# Patient Record
Sex: Male | Born: 1945 | Race: Black or African American | Hispanic: No | Marital: Single | Smoking: Never smoker
Health system: Southern US, Community
[De-identification: ages and names within clinical notes are randomized; demographics above are authoritative.]

## PROBLEM LIST (undated history)

## (undated) DIAGNOSIS — E785 Hyperlipidemia, unspecified: Secondary | ICD-10-CM

## (undated) DIAGNOSIS — M109 Gout, unspecified: Secondary | ICD-10-CM

## (undated) DIAGNOSIS — I10 Essential (primary) hypertension: Secondary | ICD-10-CM

## (undated) DIAGNOSIS — M1990 Unspecified osteoarthritis, unspecified site: Secondary | ICD-10-CM

---

## 2014-12-22 ENCOUNTER — Emergency Department: Payer: Medicare Other

## 2014-12-22 ENCOUNTER — Encounter: Payer: Self-pay | Admitting: Emergency Medicine

## 2014-12-22 ENCOUNTER — Inpatient Hospital Stay
Admission: EM | Admit: 2014-12-22 | Discharge: 2015-01-08 | DRG: 870 | Disposition: E | Payer: Medicare Other | Attending: Specialist | Admitting: Specialist

## 2014-12-22 DIAGNOSIS — M1000 Idiopathic gout, unspecified site: Secondary | ICD-10-CM | POA: Diagnosis present

## 2014-12-22 DIAGNOSIS — G9349 Other encephalopathy: Secondary | ICD-10-CM | POA: Diagnosis present

## 2014-12-22 DIAGNOSIS — Z66 Do not resuscitate: Secondary | ICD-10-CM | POA: Diagnosis not present

## 2014-12-22 DIAGNOSIS — A4101 Sepsis due to Methicillin susceptible Staphylococcus aureus: Secondary | ICD-10-CM | POA: Diagnosis not present

## 2014-12-22 DIAGNOSIS — Z515 Encounter for palliative care: Secondary | ICD-10-CM | POA: Diagnosis not present

## 2014-12-22 DIAGNOSIS — D72829 Elevated white blood cell count, unspecified: Secondary | ICD-10-CM | POA: Diagnosis not present

## 2014-12-22 DIAGNOSIS — E1165 Type 2 diabetes mellitus with hyperglycemia: Secondary | ICD-10-CM | POA: Diagnosis present

## 2014-12-22 DIAGNOSIS — I639 Cerebral infarction, unspecified: Secondary | ICD-10-CM | POA: Diagnosis present

## 2014-12-22 DIAGNOSIS — D649 Anemia, unspecified: Secondary | ICD-10-CM | POA: Diagnosis present

## 2014-12-22 DIAGNOSIS — E875 Hyperkalemia: Secondary | ICD-10-CM | POA: Diagnosis not present

## 2014-12-22 DIAGNOSIS — I129 Hypertensive chronic kidney disease with stage 1 through stage 4 chronic kidney disease, or unspecified chronic kidney disease: Secondary | ICD-10-CM | POA: Diagnosis present

## 2014-12-22 DIAGNOSIS — M109 Gout, unspecified: Secondary | ICD-10-CM | POA: Insufficient documentation

## 2014-12-22 DIAGNOSIS — E871 Hypo-osmolality and hyponatremia: Secondary | ICD-10-CM | POA: Diagnosis present

## 2014-12-22 DIAGNOSIS — Z4659 Encounter for fitting and adjustment of other gastrointestinal appliance and device: Secondary | ICD-10-CM

## 2014-12-22 DIAGNOSIS — R4182 Altered mental status, unspecified: Secondary | ICD-10-CM | POA: Diagnosis not present

## 2014-12-22 DIAGNOSIS — Z978 Presence of other specified devices: Secondary | ICD-10-CM

## 2014-12-22 DIAGNOSIS — G8194 Hemiplegia, unspecified affecting left nondominant side: Secondary | ICD-10-CM | POA: Diagnosis present

## 2014-12-22 DIAGNOSIS — I959 Hypotension, unspecified: Secondary | ICD-10-CM | POA: Diagnosis not present

## 2014-12-22 DIAGNOSIS — I10 Essential (primary) hypertension: Secondary | ICD-10-CM | POA: Diagnosis not present

## 2014-12-22 DIAGNOSIS — Z452 Encounter for adjustment and management of vascular access device: Secondary | ICD-10-CM

## 2014-12-22 DIAGNOSIS — N170 Acute kidney failure with tubular necrosis: Secondary | ICD-10-CM | POA: Diagnosis not present

## 2014-12-22 DIAGNOSIS — R4702 Dysphasia: Secondary | ICD-10-CM | POA: Diagnosis present

## 2014-12-22 DIAGNOSIS — T17908D Unspecified foreign body in respiratory tract, part unspecified causing other injury, subsequent encounter: Secondary | ICD-10-CM

## 2014-12-22 DIAGNOSIS — F10231 Alcohol dependence with withdrawal delirium: Secondary | ICD-10-CM | POA: Diagnosis present

## 2014-12-22 DIAGNOSIS — E669 Obesity, unspecified: Secondary | ICD-10-CM | POA: Diagnosis present

## 2014-12-22 DIAGNOSIS — E785 Hyperlipidemia, unspecified: Secondary | ICD-10-CM | POA: Diagnosis present

## 2014-12-22 DIAGNOSIS — G40909 Epilepsy, unspecified, not intractable, without status epilepticus: Secondary | ICD-10-CM | POA: Diagnosis present

## 2014-12-22 DIAGNOSIS — J690 Pneumonitis due to inhalation of food and vomit: Secondary | ICD-10-CM | POA: Diagnosis not present

## 2014-12-22 DIAGNOSIS — F1010 Alcohol abuse, uncomplicated: Secondary | ICD-10-CM

## 2014-12-22 DIAGNOSIS — R29810 Facial weakness: Secondary | ICD-10-CM | POA: Diagnosis present

## 2014-12-22 DIAGNOSIS — N189 Chronic kidney disease, unspecified: Secondary | ICD-10-CM | POA: Diagnosis present

## 2014-12-22 DIAGNOSIS — R6520 Severe sepsis without septic shock: Secondary | ICD-10-CM | POA: Diagnosis not present

## 2014-12-22 DIAGNOSIS — A4181 Sepsis due to Enterococcus: Secondary | ICD-10-CM | POA: Diagnosis not present

## 2014-12-22 DIAGNOSIS — R569 Unspecified convulsions: Secondary | ICD-10-CM

## 2014-12-22 DIAGNOSIS — J9600 Acute respiratory failure, unspecified whether with hypoxia or hypercapnia: Secondary | ICD-10-CM | POA: Diagnosis not present

## 2014-12-22 DIAGNOSIS — E878 Other disorders of electrolyte and fluid balance, not elsewhere classified: Secondary | ICD-10-CM | POA: Diagnosis present

## 2014-12-22 DIAGNOSIS — M1990 Unspecified osteoarthritis, unspecified site: Secondary | ICD-10-CM | POA: Diagnosis present

## 2014-12-22 DIAGNOSIS — E876 Hypokalemia: Secondary | ICD-10-CM

## 2014-12-22 DIAGNOSIS — B952 Enterococcus as the cause of diseases classified elsewhere: Secondary | ICD-10-CM

## 2014-12-22 DIAGNOSIS — R1310 Dysphagia, unspecified: Secondary | ICD-10-CM | POA: Diagnosis present

## 2014-12-22 DIAGNOSIS — Z880 Allergy status to penicillin: Secondary | ICD-10-CM | POA: Diagnosis not present

## 2014-12-22 DIAGNOSIS — J9601 Acute respiratory failure with hypoxia: Secondary | ICD-10-CM | POA: Diagnosis not present

## 2014-12-22 DIAGNOSIS — Z0189 Encounter for other specified special examinations: Secondary | ICD-10-CM

## 2014-12-22 DIAGNOSIS — R0989 Other specified symptoms and signs involving the circulatory and respiratory systems: Secondary | ICD-10-CM

## 2014-12-22 DIAGNOSIS — R7881 Bacteremia: Secondary | ICD-10-CM

## 2014-12-22 DIAGNOSIS — R0602 Shortness of breath: Secondary | ICD-10-CM

## 2014-12-22 DIAGNOSIS — T17908A Unspecified foreign body in respiratory tract, part unspecified causing other injury, initial encounter: Secondary | ICD-10-CM | POA: Diagnosis present

## 2014-12-22 DIAGNOSIS — J15211 Pneumonia due to Methicillin susceptible Staphylococcus aureus: Secondary | ICD-10-CM

## 2014-12-22 DIAGNOSIS — Z6841 Body Mass Index (BMI) 40.0 and over, adult: Secondary | ICD-10-CM | POA: Diagnosis not present

## 2014-12-22 HISTORY — DX: Hyperlipidemia, unspecified: E78.5

## 2014-12-22 HISTORY — DX: Essential (primary) hypertension: I10

## 2014-12-22 HISTORY — DX: Gout, unspecified: M10.9

## 2014-12-22 HISTORY — DX: Unspecified osteoarthritis, unspecified site: M19.90

## 2014-12-22 LAB — COMPREHENSIVE METABOLIC PANEL
ALBUMIN: 4 g/dL (ref 3.5–5.0)
ALT: 32 U/L (ref 17–63)
AST: 128 U/L — AB (ref 15–41)
Alkaline Phosphatase: 80 U/L (ref 38–126)
Anion gap: 24 — ABNORMAL HIGH (ref 5–15)
CHLORIDE: 76 mmol/L — AB (ref 101–111)
CO2: 21 mmol/L — ABNORMAL LOW (ref 22–32)
Calcium: 8.2 mg/dL — ABNORMAL LOW (ref 8.9–10.3)
Creatinine, Ser: 1.21 mg/dL (ref 0.61–1.24)
GFR calc non Af Amer: 59 mL/min — ABNORMAL LOW (ref >60)
Glucose, Bld: 141 mg/dL — ABNORMAL HIGH (ref 65–99)
Potassium: 2.6 mmol/L — CL (ref 3.5–5.1)
SODIUM: 121 mmol/L — AB (ref 135–145)
TOTAL PROTEIN: 6.9 g/dL (ref 6.5–8.1)
Total Bilirubin: 1.7 mg/dL — ABNORMAL HIGH (ref 0.3–1.2)

## 2014-12-22 LAB — URINALYSIS COMPLETE WITH MICROSCOPIC (ARMC ONLY)
BILIRUBIN URINE: NEGATIVE
Glucose, UA: NEGATIVE mg/dL
Ketones, ur: NEGATIVE mg/dL
LEUKOCYTES UA: NEGATIVE
Nitrite: NEGATIVE
PH: 5 (ref 5.0–8.0)
PROTEIN: 30 mg/dL — AB
SPECIFIC GRAVITY, URINE: 1.011 (ref 1.005–1.030)
Squamous Epithelial / LPF: NONE SEEN

## 2014-12-22 LAB — CBC WITH DIFFERENTIAL/PLATELET
Basophils Absolute: 0 10*3/uL (ref 0–0.1)
Basophils Relative: 0 %
EOS ABS: 0 10*3/uL (ref 0–0.7)
Eosinophils Relative: 0 %
HCT: 37.8 % — ABNORMAL LOW (ref 40.0–52.0)
Hemoglobin: 11.8 g/dL — ABNORMAL LOW (ref 13.0–18.0)
LYMPHS PCT: 7 %
Lymphs Abs: 1.9 10*3/uL (ref 1.0–3.6)
MCH: 25.6 pg — ABNORMAL LOW (ref 26.0–34.0)
MCHC: 31.2 g/dL — AB (ref 32.0–36.0)
MCV: 82.1 fL (ref 80.0–100.0)
Monocytes Absolute: 2.2 10*3/uL — ABNORMAL HIGH (ref 0.2–1.0)
Monocytes Relative: 8 %
Neutro Abs: 23.5 10*3/uL — ABNORMAL HIGH (ref 1.4–6.5)
Neutrophils Relative %: 85 %
Platelets: 283 10*3/uL (ref 150–440)
RBC: 4.61 MIL/uL (ref 4.40–5.90)
RDW: 18.5 % — ABNORMAL HIGH (ref 11.5–14.5)
WBC: 27.6 10*3/uL — ABNORMAL HIGH (ref 3.8–10.6)

## 2014-12-22 LAB — GLUCOSE, CAPILLARY: Glucose-Capillary: 125 mg/dL — ABNORMAL HIGH (ref 65–99)

## 2014-12-22 LAB — URINE DRUG SCREEN, QUALITATIVE (ARMC ONLY)
AMPHETAMINES, UR SCREEN: NOT DETECTED
Barbiturates, Ur Screen: NOT DETECTED
Benzodiazepine, Ur Scrn: NOT DETECTED
CANNABINOID 50 NG, UR ~~LOC~~: NOT DETECTED
Cocaine Metabolite,Ur ~~LOC~~: NOT DETECTED
MDMA (Ecstasy)Ur Screen: NOT DETECTED
METHADONE SCREEN, URINE: NOT DETECTED
Opiate, Ur Screen: NOT DETECTED
Phencyclidine (PCP) Ur S: NOT DETECTED
Tricyclic, Ur Screen: NOT DETECTED

## 2014-12-22 LAB — TROPONIN I: TROPONIN I: 0.04 ng/mL — AB (ref <0.031)

## 2014-12-22 LAB — ETHANOL

## 2014-12-22 LAB — LACTIC ACID, PLASMA: LACTIC ACID, VENOUS: 16.4 mmol/L — AB (ref 0.5–2.0)

## 2014-12-22 MED ORDER — PIPERACILLIN-TAZOBACTAM 3.375 G IVPB: 3.3750 g | Freq: Three times a day (TID) | INTRAVENOUS | Status: DC

## 2014-12-22 MED ORDER — POTASSIUM CHLORIDE IN NACL 40-0.9 MEQ/L-% IV SOLN
INTRAVENOUS | Status: AC
  Administered 2014-12-22: 150 mL/h via INTRAVENOUS
  Filled 2014-12-22: qty 1000

## 2014-12-22 MED ORDER — METOPROLOL TARTRATE 1 MG/ML IV SOLN
5.0000 mg | Freq: Four times a day (QID) | INTRAVENOUS | Status: DC
  Administered 2014-12-22 – 2015-01-03 (×47): 5 mg via INTRAVENOUS
  Filled 2014-12-22 (×47): qty 5

## 2014-12-22 MED ORDER — ASPIRIN EC 81 MG PO TBEC
81.0000 mg | DELAYED_RELEASE_TABLET | Freq: Every day | ORAL | Status: DC
Start: 2014-12-23 — End: 2014-12-23

## 2014-12-22 MED ORDER — METOPROLOL TARTRATE 1 MG/ML IV SOLN
INTRAVENOUS | Status: AC
  Administered 2014-12-22: 5 mg via INTRAVENOUS
  Filled 2014-12-22: qty 5

## 2014-12-22 MED ORDER — LORAZEPAM 2 MG/ML IJ SOLN
2.0000 mg | INTRAMUSCULAR | Status: DC | PRN
  Administered 2014-12-23 – 2014-12-25 (×2): 2 mg via INTRAVENOUS
  Filled 2014-12-22 (×2): qty 1

## 2014-12-22 MED ORDER — PIPERACILLIN-TAZOBACTAM 3.375 G IVPB
3.3750 g | Freq: Once | INTRAVENOUS | Status: DC
  Administered 2014-12-22: 3.375 g via INTRAVENOUS

## 2014-12-22 MED ORDER — ALBUTEROL SULFATE (2.5 MG/3ML) 0.083% IN NEBU
2.5000 mg | INHALATION_SOLUTION | Freq: Four times a day (QID) | RESPIRATORY_TRACT | Status: DC
  Administered 2014-12-22 – 2015-01-02 (×45): 2.5 mg via RESPIRATORY_TRACT
  Filled 2014-12-22 (×44): qty 3

## 2014-12-22 MED ORDER — ACETAMINOPHEN 650 MG RE SUPP
650.0000 mg | Freq: Four times a day (QID) | RECTAL | Status: DC | PRN
  Filled 2014-12-22: qty 1

## 2014-12-22 MED ORDER — KCL IN DEXTROSE-NACL 40-5-0.45 MEQ/L-%-% IV SOLN
INTRAVENOUS | Status: AC
  Administered 2014-12-22: 1000 mL via INTRAVENOUS
  Filled 2014-12-22: qty 1000

## 2014-12-22 MED ORDER — LORAZEPAM 2 MG/ML IJ SOLN
INTRAMUSCULAR | Status: AC
  Filled 2014-12-22: qty 1

## 2014-12-22 MED ORDER — ONDANSETRON HCL 4 MG PO TABS: 4.0000 mg | ORAL_TABLET | Freq: Four times a day (QID) | ORAL | Status: DC | PRN

## 2014-12-22 MED ORDER — PIPERACILLIN-TAZOBACTAM 3.375 G IVPB
INTRAVENOUS | Status: AC
  Administered 2014-12-22: 3.375 g via INTRAVENOUS
  Filled 2014-12-22: qty 50

## 2014-12-22 MED ORDER — VANCOMYCIN HCL IN DEXTROSE 1-5 GM/200ML-% IV SOLN
INTRAVENOUS | Status: AC
  Administered 2014-12-22: 1000 mg via INTRAVENOUS
  Filled 2014-12-22: qty 200

## 2014-12-22 MED ORDER — LORAZEPAM 2 MG/ML IJ SOLN
2.0000 mg | Freq: Once | INTRAMUSCULAR | Status: AC
  Administered 2014-12-22: 2 mg via INTRAVENOUS

## 2014-12-22 MED ORDER — ONDANSETRON HCL 4 MG/2ML IJ SOLN: 4.0000 mg | Freq: Four times a day (QID) | INTRAMUSCULAR | Status: DC | PRN

## 2014-12-22 MED ORDER — PANTOPRAZOLE SODIUM 40 MG IV SOLR
40.0000 mg | Freq: Two times a day (BID) | INTRAVENOUS | Status: DC
  Administered 2014-12-22 – 2014-12-23 (×2): 40 mg via INTRAVENOUS
  Filled 2014-12-22 (×2): qty 40

## 2014-12-22 MED ORDER — HEPARIN SODIUM (PORCINE) 5000 UNIT/ML IJ SOLN
5000.0000 [IU] | Freq: Three times a day (TID) | INTRAMUSCULAR | Status: DC
  Administered 2014-12-22 – 2015-01-03 (×35): 5000 [IU] via SUBCUTANEOUS
  Filled 2014-12-22 (×34): qty 1

## 2014-12-22 MED ORDER — ADULT MULTIVITAMIN W/MINERALS CH: 1.0000 | ORAL_TABLET | Freq: Every day | ORAL | Status: DC

## 2014-12-22 MED ORDER — LEVOFLOXACIN IN D5W 500 MG/100ML IV SOLN
500.0000 mg | INTRAVENOUS | Status: DC
  Administered 2014-12-22: 500 mg via INTRAVENOUS
  Filled 2014-12-22: qty 100

## 2014-12-22 MED ORDER — VANCOMYCIN HCL IN DEXTROSE 1-5 GM/200ML-% IV SOLN
1000.0000 mg | Freq: Once | INTRAVENOUS | Status: AC
  Administered 2014-12-22: 1000 mg via INTRAVENOUS

## 2014-12-22 MED ORDER — VITAMIN B-1 100 MG PO TABS: 100.0000 mg | ORAL_TABLET | Freq: Every day | ORAL | Status: DC

## 2014-12-22 MED ORDER — SODIUM CHLORIDE 0.9 % IV SOLN
Freq: Once | INTRAVENOUS | Status: AC
  Administered 2014-12-22: 1000 mL via INTRAVENOUS

## 2014-12-22 MED ORDER — DOCUSATE SODIUM 100 MG PO CAPS
100.0000 mg | ORAL_CAPSULE | Freq: Two times a day (BID) | ORAL | Status: DC
  Administered 2014-12-25 – 2014-12-26 (×2): 100 mg via ORAL
  Filled 2014-12-22 (×4): qty 1

## 2014-12-22 MED ORDER — POTASSIUM CHLORIDE IN NACL 40-0.9 MEQ/L-% IV SOLN
INTRAVENOUS | Status: DC
  Administered 2014-12-22 – 2014-12-23 (×4): 150 mL/h via INTRAVENOUS
  Administered 2014-12-24: 100 mL/h via INTRAVENOUS
  Administered 2014-12-24 (×2): 150 mL/h via INTRAVENOUS
  Administered 2014-12-25 – 2014-12-26 (×4): 100 mL/h via INTRAVENOUS
  Filled 2014-12-22 (×18): qty 1000

## 2014-12-22 MED ORDER — SODIUM CHLORIDE 0.9 % IJ SOLN
3.0000 mL | Freq: Two times a day (BID) | INTRAMUSCULAR | Status: DC
  Administered 2014-12-22 – 2015-01-02 (×17): 3 mL via INTRAVENOUS

## 2014-12-22 MED ORDER — FOLIC ACID 1 MG PO TABS: 1.0000 mg | ORAL_TABLET | Freq: Every day | ORAL | Status: DC

## 2014-12-22 MED ORDER — KCL IN DEXTROSE-NACL 40-5-0.45 MEQ/L-%-% IV SOLN
INTRAVENOUS | Status: DC
  Administered 2014-12-22: 1000 mL via INTRAVENOUS

## 2014-12-22 MED ORDER — SODIUM CHLORIDE 0.9 % IV SOLN
500.0000 mg | Freq: Two times a day (BID) | INTRAVENOUS | Status: DC
  Administered 2014-12-22 – 2014-12-25 (×6): 500 mg via INTRAVENOUS
  Filled 2014-12-22 (×8): qty 5

## 2014-12-22 MED ORDER — LEVOFLOXACIN IN D5W 500 MG/100ML IV SOLN
INTRAVENOUS | Status: AC
  Administered 2014-12-22: 500 mg via INTRAVENOUS
  Filled 2014-12-22: qty 100

## 2014-12-22 MED ORDER — ACETAMINOPHEN 325 MG PO TABS
650.0000 mg | ORAL_TABLET | Freq: Four times a day (QID) | ORAL | Status: DC | PRN
Start: 2014-12-22 — End: 2015-01-03
  Administered 2014-12-26 – 2014-12-30 (×3): 650 mg via ORAL
  Filled 2014-12-22 (×3): qty 2

## 2014-12-22 NOTE — H&P (Signed)
History and Physical    Cristie HemLester Dreisbach XBJ:[id number] DOB: 12/17/1945 DOA: 2015-02-10  Referring physician: Dr.Williams [facility]CP: [facility]  Specialists: none  Chief Complaint: CVA  HPI: Cristie HemLester Gluth is a 69 y.o. male has a past medical history significant for HTN and gout found by family today with let facial droop, left-sided weakness, and unresponsiveness. Had apparently been drinking ETOH heavily. Had seizure in ER requiring ativan. Now lethargic and sedated. Unable to give hx.  Review of Systems: unable to obtain  Past Medical History  Diagnosis Date  . Hypertension   . Hyperlipidemia   . Arthritis   . Gout    History reviewed. No pertinent past surgical history. Social History:  reports that he has never smoked. He does not have any smokeless tobacco history on file. He reports that he drinks alcohol. His drug history is not on file.  Allergies  Allergen Reactions  . Penicillins Other (See Comments)    Unknown reaction    Family History  Problem Relation Age of Onset  . Family history unknown: Yes    Prior to Admission medications   Medication Sig Start Date End Date Taking? Authorizing Provider  amLODipine (NORVASC) 5 MG tablet Take 5 mg by mouth daily.   Yes Historical Provider, MD  colchicine 0.6 MG tablet Take 0.6 mg by mouth daily.   Yes Historical Provider, MD  lisinopril (PRINIVIL,ZESTRIL) 40 MG tablet Take 40 mg by mouth daily.   Yes Historical Provider, MD  Multiple Vitamins-Minerals (CENTRUM SILVER ADULT 50+) TABS Take 1 tablet by mouth daily.   Yes Historical Provider, MD  vitamin B-12 (CYANOCOBALAMIN) 1000 MCG tablet Take 1,000 mcg by mouth daily.   Yes Historical Provider, MD   Physical Exam: Filed Vitals:   25-Dec-2014 1645 25-Dec-2014 1700 25-Dec-2014 1715 25-Dec-2014 1730  BP:  146/87    Pulse: 99 104 102 103  Temp:      TempSrc:      Resp: 17 15 15 16   Height:      Weight:      SpO2: 96% 97% 96% 97%     General:  Lethargic, not verbally  responsive  Eyes: PERRL, EOMI, no scleral icterus  ENT: moist oropharynx  Neck: supple, no lymphadenopathy  Cardiovascular: regular rate without MRG; 2+ peripheral pulses, no JVD, no peripheral edema  Respiratory: diffuse rhonchi without wheezes or rales.  Abdomen: soft, non tender to palpation, positive bowel sounds, no guarding, no rebound  Skin: no rashes  Musculoskeletal: normal bulk and tone, no joint swelling  Psychiatric: unable to assess  Neurologic: Left facial droop and LUE weakness present.  Labs on Admission:  Basic Metabolic Panel:  Recent Labs Lab 25-Dec-2014 1444  NA 121*  K 2.6*  CL 76*  CO2 21*  GLUCOSE 141*  BUN <5*  CREATININE 1.21  CALCIUM 8.2*   Liver Function Tests:  Recent Labs Lab 25-Dec-2014 1444  AST 128*  ALT 32  ALKPHOS 80  BILITOT 1.7*  PROT 6.9  ALBUMIN 4.0   No results for input(s): LIPASE, AMYLASE in the last 168 hours. No results for input(s): AMMONIA in the last 168 hours. CBC:  Recent Labs Lab 25-Dec-2014 1444  WBC 27.6*  NEUTROABS 23.5*  HGB 11.8*  HCT 37.8*  MCV 82.1  PLT 283   Cardiac Enzymes: No results for input(s): CKTOTAL, CKMB, CKMBINDEX, TROPONINI in the last 168 hours.  BNP (last 3 results) No results for input(s): BNP in the last 8760 hours.  ProBNP (last 3 results) No  results for input(s): PROBNP in the last 8760 hours.  CBG: No results for input(s): GLUCAP in the last 168 hours.  Radiological Exams on Admission: Dg Chest 2 View  04/05/15   CLINICAL DATA:  Possible aspiration.  Abnormal lung sounds.  EXAM: CHEST  2 VIEW  COMPARISON:  None.  FINDINGS: The heart size and mediastinal contours are within normal limits. Both lungs are clear. No pneumothorax or pleural effusion is noted. The visualized skeletal structures are unremarkable.  IMPRESSION: No active cardiopulmonary disease.   Electronically Signed   By: Lupita RaiderJames  Green Jr, M.D.   On: 008/27/16 15:02   Ct Head (brain) Wo Contrast  04/05/15    CLINICAL DATA:  Unresponsive, left-sided facial droop.  EXAM: CT HEAD WITHOUT CONTRAST  TECHNIQUE: Contiguous axial images were obtained from the base of the skull through the vertex without intravenous contrast.  COMPARISON:  None.  FINDINGS: Bony calvarium appears intact. Minimal diffuse cortical atrophy is noted. Mild chronic ischemic white matter disease is noted. No mass effect or midline shift is noted. Ventricular size is within normal limits. There is no evidence of mass lesion, hemorrhage or acute infarction.  IMPRESSION: Minimal diffuse cortical atrophy. Mild chronic ischemic white matter disease. No acute intracranial abnormality seen. These results were called by telephone at the time of interpretation on 04/05/15 at 2:42 pm to Dr. Lenard LancePaduchowski, who verbally acknowledged these results.   Electronically Signed   By: Lupita RaiderJames  Green Jr, M.D.   On: 008/27/16 14:44    EKG: Independently reviewed.  Assessment/Plan Active Problems:   CVA (cerebral infarction)   Seizure disorder   Will admit to ICU with IV ABX and IV Keppra. Order MRI of brain with carotids and echo. Neuro checks q4h and consult Neurology. Begin IV fluids with K+ and f/u labs in AM.  Diet: NPO Fluids: see records DVT Prophylaxis: SQ Heparin  Code Status: FULL  Family Communication: yes  Disposition Plan: SNF  Time spent: 6550

## 2014-12-22 NOTE — ED Provider Notes (Signed)
[facility] Emergency Department Provider Note  ____________________________________________  Time seen: On arrival  I have reviewed the triage vital signs and the nursing notes.   HISTORY  Chief Complaint No chief complaint on file.   History Limited secondary to altered mental status   HPI Lucas Ortega is a 69 y.o. male who presents from home with possible stroke. History is limited but reportedly patient was house sitting and when sister came home today she found that the left side of his face was drooping. He also had an empty bottle of gin next to him. It is unclear when symptoms started. Patient says that he feels "okay". No history of the same    No past medical history on file.  There are no active problems to display for this patient.   No past surgical history on file.  No current outpatient prescriptions on file.  Allergies Review of patient's allergies indicates not on file.  No family history on file.  Social History History  Substance Use Topics  . Smoking status: Not on file  . Smokeless tobacco: Not on file  . Alcohol Use: Not on file    Review of Systems  Constitutional: Negative for fever. Eyes: Negative for visual changes. ENT: Negative for sore throat. Cardiovascular: Negative for chest pain. Respiratory: Negative for shortness of breath. Gastrointestinal: Positive for vomiting Genitourinary: Negative for dysuria. Musculoskeletal: Negative for back pain. Skin: Negative for rash. Neurological: Negative for headaches, focal weakness or numbness.   10-point ROS otherwise negative.  ____________________________________________   PHYSICAL EXAM: BP 160/87 mmHg  Pulse 101  Temp(Src) 97.8 F (36.6 C) (Oral)  Resp 28  Ht 5\' 10"  (1.778 m)  Wt 276 lb (125.193 kg)  BMI 39.60 kg/m2  SpO2 96%     Constitutional: Poor hygiene, slow to respond Eyes: Conjunctivae are normal. PERRL. Normal extraocular  movements. ENT   Head: Normocephalic and atraumatic.   Mouth/Throat: Mucous membranes are moist. . Cardiovascular: Normal rate, regular rhythm. Normal and symmetric distal pulses are present in all extremities. No murmurs, rubs, or gallops. Respiratory: Normal respiratory effort without tachypnea nor retractions. Breath sounds are clear and equal bilaterally. Positive Rales bibasilarly Gastrointestinal: Soft and nontender. No distention. There is no CVA tenderness. Genitourinary: deferred Musculoskeletal: Nontender with normal range of motion in all extremities. No joint effusions.  No lower extremity tenderness nor edema. Neurologic:  Patient with left-sided facial droop which appears to exclude the forehead. Does appear to have less strength in the left upper extremity than the right. Skin:  Skin is warm, dry and intact. No rash noted. Psychiatric: Patient slow to respond  ____________________________________________    LABS (pertinent positives/negatives)  Significant for white blood cell count of 27,000  ____________________________________________   EKG  ED ECG REPORT   Date: 01/05/2015  EKG Time: 2:50 PM  Rate: 109  Rhythm: sinus tachycardia, left axis deviation  Axis: Left axis deviation  Intervals:none  ST&T Change: Nonspecific ST changes   ____________________________________________    RADIOLOGY  CT read as normal by radiology  ____________________________________________   PROCEDURES  Procedure(s) performed: None  Critical Care performed: None  ____________________________________________   INITIAL IMPRESSION / ASSESSMENT AND PLAN / ED COURSE  Pertinent labs & imaging results that were available during my care of the patient were reviewed by me and considered in my medical decision making (see chart for details).  Patient's initial presentation consistent with CVA with unknown time of onset, hence he is not a TPA candidate. Alcohol is  probably also involved which complicates the picture.  ----------------------------------------- 2:57 PM on 04-13-2015 -----------------------------------------  Patient apparently had generalized tonic-clonic seizure witnessed by nurse. Given 2 mg of IV Ativan  ____________________________________________   ----------------------------------------- 3:48 PM on 04-13-2015 -----------------------------------------  Patient resting comfortably at this time. Pending labs, will sign out to Dr. Mayford KnifeWilliams  FINAL CLINICAL IMPRESSION(S) / ED DIAGNOSES  Final diagnoses:  CVA (cerebral vascular accident)  Seizure     Jene Everyobert Price [place], MD 2014-10-13 [phone]

## 2014-12-22 NOTE — ED Notes (Signed)
Patient's O2 sats dropped to 85% on RA. Patient nonverbal at this time. Dr. Cyril LoosenKinner notified. Per Dr. Cyril LoosenKinner, nasal cannula placed. O2 sats returned to 98% on 3L.

## 2014-12-22 NOTE — ED Notes (Addendum)
Patient continues to be nonverbal with somnolence. Family now at bedside. States patient is heavy drinker. Patient continues to be on 3L O2 via nasal cannula and is tolerating well with current O2 sat at 96%.

## 2014-12-22 NOTE — ED Notes (Signed)
Patient had just returned to ER room from CT/X-ray with this RN. Patient had tonic clonic seizure that lasted approx 1.5 minutes. Patient's mouth suctioned. No obvious injuries noted after seizure. Patient continues to be post-ictal. Dr. Lenard LancePaduchowski to bedside.

## 2014-12-22 NOTE — ED Provider Notes (Signed)
Patient received in checkout by Dr. Cyril LoosenKinner, multiple issues to address. Patient is altered from likely CVA right MCA distribution. Also alcohol intoxication, and electrolyte abnormality secondary to heavy EtOH abuse. Patient also seized received IV Ativan. Family is advised of poor prognosis.  Assessment: Altered mental status likely multifactorial, hyponatremia, hypokalemia, hypochloremia, seizure, alcohol abuse  Plan: We'll start repeating his potassium as well as giving him saline, I have covered him with bank and Zosyn for antibiotics. At this point is localizing to pain, he has a strong gag reflex, he does not meet criteria for intubation.  Emily FilbertJonathan E Williams, MD 12/26/2014 [phone]

## 2014-12-22 NOTE — ED Notes (Signed)
Patient arrives from sister's home via EMS for c/o AMS. Sister went out of town for three days, patient was watching her house for her. When sister arrived back home, patient was less verbally responsive, seemed confused, and stated he had not taken his medications/had anything to eat or drink for last 3 days. Patient looking around room upon arrival, but nonverbal. Per EMS, patient was initially sticking fingers down throat to vomit. Was given 4mg  IM Zofran en route. Patient denied ETOH to EMS, but had 1/2 bottle of Gin sitting next to where he was found. Patient denied any pain upon EMS arrival.

## 2014-12-23 ENCOUNTER — Inpatient Hospital Stay: Payer: Medicare Other

## 2014-12-23 ENCOUNTER — Encounter: Payer: Self-pay | Admitting: *Deleted

## 2014-12-23 ENCOUNTER — Inpatient Hospital Stay
Admit: 2014-12-23 | Discharge: 2014-12-23 | Disposition: A | Discharge status: Home / Self Care | Payer: Medicare Other | Attending: Internal Medicine | Admitting: Internal Medicine

## 2014-12-23 LAB — CBC
HCT: 34 % — ABNORMAL LOW (ref 40.0–52.0)
HEMOGLOBIN: 10.9 g/dL — AB (ref 13.0–18.0)
MCH: 25.7 pg — AB (ref 26.0–34.0)
MCHC: 32.1 g/dL (ref 32.0–36.0)
MCV: 80 fL (ref 80.0–100.0)
PLATELETS: 267 10*3/uL (ref 150–440)
RBC: 4.25 MIL/uL — ABNORMAL LOW (ref 4.40–5.90)
RDW: 18.4 % — AB (ref 11.5–14.5)
WBC: 14.3 10*3/uL — ABNORMAL HIGH (ref 3.8–10.6)

## 2014-12-23 LAB — PROTIME-INR
INR: 1.09
Prothrombin Time: 14.3 seconds (ref 11.4–15.0)

## 2014-12-23 LAB — COMPREHENSIVE METABOLIC PANEL
ALT: 29 U/L (ref 17–63)
ANION GAP: 10 (ref 5–15)
AST: 107 U/L — ABNORMAL HIGH (ref 15–41)
Albumin: 3.6 g/dL (ref 3.5–5.0)
Alkaline Phosphatase: 67 U/L (ref 38–126)
CO2: 31 mmol/L (ref 22–32)
Calcium: 8.1 mg/dL — ABNORMAL LOW (ref 8.9–10.3)
Chloride: 94 mmol/L — ABNORMAL LOW (ref 101–111)
Creatinine, Ser: 1.09 mg/dL (ref 0.61–1.24)
GFR calc non Af Amer: >60 mL/min (ref >60)
GLUCOSE: 101 mg/dL — AB (ref 65–99)
Potassium: 2.6 mmol/L — CL (ref 3.5–5.1)
Sodium: 135 mmol/L (ref 135–145)
TOTAL PROTEIN: 6.5 g/dL (ref 6.5–8.1)
Total Bilirubin: 1.4 mg/dL — ABNORMAL HIGH (ref 0.3–1.2)

## 2014-12-23 LAB — LACTIC ACID, PLASMA
LACTIC ACID, VENOUS: 5.1 mmol/L — AB (ref 0.5–2.0)
Lactic Acid, Venous: 1.9 mmol/L (ref 0.5–2.0)

## 2014-12-23 LAB — POTASSIUM
POTASSIUM: 3 mmol/L — AB (ref 3.5–5.1)
Potassium: 3.6 mmol/L (ref 3.5–5.1)

## 2014-12-23 LAB — MAGNESIUM
Magnesium: 1.8 mg/dL (ref 1.7–2.4)
Magnesium: 2.1 mg/dL (ref 1.7–2.4)

## 2014-12-23 LAB — PHOSPHORUS: PHOSPHORUS: 2.9 mg/dL (ref 2.5–4.6)

## 2014-12-23 LAB — GLUCOSE, CAPILLARY: Glucose-Capillary: 123 mg/dL — ABNORMAL HIGH (ref 65–99)

## 2014-12-23 MED ORDER — PANTOPRAZOLE SODIUM 40 MG IV SOLR
40.0000 mg | Freq: Two times a day (BID) | INTRAVENOUS | Status: DC
  Administered 2014-12-23 – 2014-12-26 (×6): 40 mg via INTRAVENOUS
  Filled 2014-12-23 (×6): qty 40

## 2014-12-23 MED ORDER — MAGNESIUM SULFATE 2 GM/50ML IV SOLN
2.0000 g | Freq: Once | INTRAVENOUS | Status: AC
  Administered 2014-12-23: 2 g via INTRAVENOUS
  Filled 2014-12-23: qty 50

## 2014-12-23 MED ORDER — ATORVASTATIN CALCIUM 20 MG PO TABS
80.0000 mg | ORAL_TABLET | Freq: Every day | ORAL | Status: DC
  Administered 2014-12-23 – 2015-01-02 (×9): 80 mg via ORAL
  Filled 2014-12-23 (×9): qty 4

## 2014-12-23 MED ORDER — POTASSIUM CHLORIDE 2 MEQ/ML IV SOLN
Freq: Once | INTRAVENOUS | Status: DC
  Administered 2014-12-23: 06:00:00 via INTRAVENOUS
  Filled 2014-12-23: qty 20

## 2014-12-23 MED ORDER — SODIUM CHLORIDE 0.9 % IV SOLN
Freq: Once | INTRAVENOUS | Status: AC
  Administered 2014-12-23: 17:00:00 via INTRAVENOUS
  Filled 2014-12-23: qty 20

## 2014-12-23 MED ORDER — ASPIRIN 81 MG PO CHEW
81.0000 mg | CHEWABLE_TABLET | Freq: Every day | ORAL | Status: DC
  Administered 2014-12-23 – 2015-01-02 (×9): 81 mg via ORAL
  Filled 2014-12-23 (×9): qty 1

## 2014-12-23 MED ORDER — GLYCOPYRROLATE 0.2 MG/ML IJ SOLN
0.2000 mg | Freq: Three times a day (TID) | INTRAMUSCULAR | Status: DC
  Administered 2014-12-23 – 2014-12-24 (×3): 0.2 mg via INTRAVENOUS
  Filled 2014-12-23 (×3): qty 1

## 2014-12-23 MED ORDER — HALOPERIDOL LACTATE 5 MG/ML IJ SOLN
2.5000 mg | Freq: Four times a day (QID) | INTRAMUSCULAR | Status: DC | PRN
  Administered 2014-12-23: 2.5 mg via INTRAVENOUS
  Filled 2014-12-23: qty 1

## 2014-12-23 MED ORDER — HYDRALAZINE HCL 20 MG/ML IJ SOLN: 10.0000 mg | Freq: Four times a day (QID) | INTRAMUSCULAR | Status: DC | PRN

## 2014-12-23 MED ORDER — POTASSIUM CHLORIDE 2 MEQ/ML IV SOLN
Freq: Once | INTRAVENOUS | Status: DC
  Filled 2014-12-23: qty 20

## 2014-12-23 MED ORDER — ASPIRIN 81 MG PO CHEW: 81.0000 mg | CHEWABLE_TABLET | Freq: Every day | ORAL | Status: DC

## 2014-12-23 MED ORDER — THIAMINE HCL 100 MG/ML IJ SOLN
INTRAVENOUS | Status: DC
  Administered 2014-12-23 – 2014-12-31 (×9): via INTRAVENOUS
  Filled 2014-12-23 (×13): qty 1000

## 2014-12-23 MED ORDER — SIMVASTATIN 20 MG PO TABS: 20.0000 mg | ORAL_TABLET | Freq: Every day | ORAL | Status: DC

## 2014-12-23 NOTE — Consult Note (Signed)
Gastroenterology Consultation  Referring Provider:     No ref. provider found Primary Care Physician:  Ashley Mariner, MD Primary Gastroenterologist:  n/a      Reason for Consultation:     Esophageal motility disorder  Date of Admission:  12/19/2014 Date of Consultation:  12/23/2014         HPI:   Lucas Ortega is a 69 y.o. male admitted with acute left-sided facial droop, weakness & unresponsiveness. He has hx chronic daily heavy ETOH.  He likes to drink Gin.  He has difficulty answering questions due to coughing with trying to initiate speech & difficulty clearing secretions.  He says he has problems eating & drinking but cannot specify how long.  Denies heartburn, indigestion, nausea, or anorexia.  He has been evaluated by Speech Pathologist.  History limited & most obtained from medical record as patient unable to answer my questions.  MRI Brain shows cluster of punctate acute infarctions in the right midbrain with chronic small-vessel ischemic changes of the cerebral hemispheric white matter.  He is awaiting neurology consult.    Past Medical History  Diagnosis Date  . Hypertension   . Hyperlipidemia   . Arthritis   . Gout     History reviewed. No pertinent past surgical history.  Prior to Admission medications   Medication Sig Start Date End Date Taking? Authorizing Provider  amLODipine (NORVASC) 5 MG tablet Take 5 mg by mouth daily.   Yes Historical Provider, MD  colchicine 0.6 MG tablet Take 0.6 mg by mouth daily.   Yes Historical Provider, MD  lisinopril (PRINIVIL,ZESTRIL) 40 MG tablet Take 40 mg by mouth daily.   Yes Historical Provider, MD  Multiple Vitamins-Minerals (CENTRUM SILVER ADULT 50+) TABS Take 1 tablet by mouth daily.   Yes Historical Provider, MD  vitamin B-12 (CYANOCOBALAMIN) 1000 MCG tablet Take 1,000 mcg by mouth daily.   Yes Historical Provider, MD    Family History  Problem Relation Age of Onset  . Family history unknown: Yes   *Unable to  obtain  History  Substance Use Topics  . Smoking status: Never Smoker   . Smokeless tobacco: Not on file  . Alcohol Use: 0.0 oz/week    0 Standard drinks or equivalent per week     Comment: GIN every day heavy    Allergies as of 12/12/2014 - Review Complete 01/06/2015  Allergen Reaction Noted  . Penicillins Other (See Comments) 12/19/2014    Review of Systems:    All systems reviewed and negative except where noted in HPI.   Physical Exam:  Vital signs in last 24 hours: Temp:  [98.5 F (36.9 C)-99.1 F (37.3 C)] 98.5 F (36.9 C) (05/16 1132) Pulse Rate:  [56-104] 90 (05/16 1400) Resp:  [6-24] 18 (05/16 1437) BP: (106-162)/(32-130) 112/72 mmHg (05/16 1437) SpO2:  [80 %-100 %] 96 % (05/16 1400) Weight:  [122.471 kg (270 lb)-125.8 kg (277 lb 5.4 oz)] 122.471 kg (270 lb) (05/16 1211)   General:   Alert,  Well-developed, obese, pleasant and cooperative.  Mild distress in that he has to use suction to clear secretions if he attempts to speak, family at bedside Head:  Normocephalic and atraumatic. Eyes:  Sclera clear, no icterus.   Conjunctiva pink.  Left lid droop. Ears:  Normal auditory acuity. Nose:  No deformity, discharge, or lesions. Mouth:  OP pink, excessive secretions. Neck:  Supple; no masses or thyromegaly. Lungs:  Respirations even and unlabored.  Clear throughout to auscultation.   No wheezes,  crackles, or rhonchi. No acute distress. Heart:  Regular rate and rhythm; no murmurs, clicks, rubs, or gallops. Abdomen:  Obese.  Normal bowel sounds.  No bruits.  Soft, non-tender and non-distended without masses, hepatosplenomegaly or hernias noted.  No guarding or rebound tenderness.  Exam limited given body habitus.  Rectal:  Deferred. Msk:  Symmetrical without gross deformities.   Pulses:  Normal pulses noted. Extremities:  No clubbing or edema.  No cyanosis. Neurologic:  Alert and oriented x3. Left-sided weakness. Skin:  Intact without significant lesions or rashes.  No  jaundice. Lymph Nodes:  No significant cervical adenopathy. Psych:  Alert and cooperative. Normal mood and affect.  LAB RESULTS:  Recent Labs  16-Jun-2015 1444 12/23/14 0408  WBC 27.6* 14.3*  HGB 11.8* 10.9*  HCT 37.8* 34.0*  PLT 283 267   BMET  Recent Labs  16-Jun-2015 1444 12/23/14 0408 12/23/14 1403  NA 121* 135  --   K 2.6* 2.6* 3.0*  CL 76* 94*  --   CO2 21* 31  --   GLUCOSE 141* 101*  --   BUN <5* <5*  --   CREATININE 1.21 1.09  --   CALCIUM 8.2* 8.1*  --    LFT  Recent Labs  12/23/14 0408  PROT 6.5  ALBUMIN 3.6  AST 107*  ALT 29  ALKPHOS 67  BILITOT 1.4*   PT/INR  Recent Labs  12/23/14 0408  LABPROT 14.3  INR 1.09    STUDIES: Dg Chest 2 View  January 21, 2015   CLINICAL DATA:  Possible aspiration.  Abnormal lung sounds.  EXAM: CHEST  2 VIEW  COMPARISON:  None.  FINDINGS: The heart size and mediastinal contours are within normal limits. Both lungs are clear. No pneumothorax or pleural effusion is noted. The visualized skeletal structures are unremarkable.  IMPRESSION: No active cardiopulmonary disease.   Electronically Signed   By: Lupita RaiderJames  Green Jr, M.D.   On: 0June 14, 2016 15:02   Ct Head (brain) Wo Contrast  January 21, 2015   CLINICAL DATA:  Unresponsive, left-sided facial droop.  EXAM: CT HEAD WITHOUT CONTRAST  TECHNIQUE: Contiguous axial images were obtained from the base of the skull through the vertex without intravenous contrast.  COMPARISON:  None.  FINDINGS: Bony calvarium appears intact. Minimal diffuse cortical atrophy is noted. Mild chronic ischemic white matter disease is noted. No mass effect or midline shift is noted. Ventricular size is within normal limits. There is no evidence of mass lesion, hemorrhage or acute infarction.  IMPRESSION: Minimal diffuse cortical atrophy. Mild chronic ischemic white matter disease. No acute intracranial abnormality seen. These results were called by telephone at the time of interpretation on January 21, 2015 at 2:42 pm to Dr.  Lenard LancePaduchowski, who verbally acknowledged these results.   Electronically Signed   By: Lupita RaiderJames  Green Jr, M.D.   On: 0June 14, 2016 14:44   Mr Brain Wo Contrast  12/23/2014   CLINICAL DATA:  Left-sided weakness and left facial droop. Acute onset.  EXAM: MRI HEAD WITHOUT CONTRAST  TECHNIQUE: Multiplanar, multiecho pulse sequences of the brain and surrounding structures were obtained without intravenous contrast.  COMPARISON:  Head CT 0June 14, 2016  FINDINGS: There are a cluster of punctate acute infarctions within the right midbrain. No other acute infarction. No focal cerebellar abnormality. The cerebral hemispheres show mild age related volume loss and minimal small vessel change of the white matter. No large vessel territory infarction. No mass lesion, hemorrhage, hydrocephalus or extra-axial collection. No pituitary mass. There are changes of mucosal inflammation affecting the left maxillary sinus.  IMPRESSION: Cluster of punctate acute infarctions in the right midbrain.  Chronic small-vessel ischemic changes of the cerebral hemispheric white matter.   Electronically Signed   By: Paulina Fusi M.D.   On: 12/23/2014 12:47   US Carotid Bilateral  12/23/2014   CLINICAL DATA:  Cerebrovascular accident.  EXAM: BILATERAL CAROTID DUPLEX ULTRASOUND  TECHNIQUE: Wallace Cullens scale imaging, color Doppler and duplex ultrasound were performed of bilateral carotid and vertebral arteries in the neck.  COMPARISON:  None.  FINDINGS: Criteria: Quantification of carotid stenosis is based on velocity parameters that correlate the residual internal carotid diameter with NASCET-based stenosis levels, using the diameter of the distal internal carotid lumen as the denominator for stenosis measurement.  The following velocity measurements were obtained:  RIGHT  ICA:  129/38 cm/sec  CCA:  96/23 cm/sec  SYSTOLIC ICA/CCA RATIO:  1.34  DIASTOLIC ICA/CCA RATIO:  1.68  ECA:  182 cm/sec  LEFT  ICA:  78/30 cm/sec  CCA:  100/24 cm/sec  SYSTOLIC ICA/CCA RATIO:   0.78  DIASTOLIC ICA/CCA RATIO:  1.24  ECA:  95 cm/sec  RIGHT CAROTID ARTERY: Moderate eccentric plaque formation is noted in the distal right common carotid artery and carotid bulb. Mild plaque formation is noted in the proximal right internal carotid artery. This is consistent with less than 50% diameter stenosis based on ultrasound and Doppler criteria.  RIGHT VERTEBRAL ARTERY:  Not visualized.  LEFT CAROTID ARTERY: Moderate irregular plaque formation is noted in the left carotid bulb with mild plaque formation seen in the proximal left internal carotid artery. This is consistent with less than 50% diameter stenosis based on ultrasound and Doppler criteria.  LEFT VERTEBRAL ARTERY:  Not visualized.  IMPRESSION: Moderate eccentric plaque formation is noted in the distal right common carotid artery right carotid bulb, with only mild stenosis noted in the proximal right internal carotid artery. This is consistent with less than 50% diameter stenosis in the proximal right internal carotid artery based on ultrasound and Doppler criteria.  Moderate irregular plaque formation is noted in the left carotid bulb with mild plaque formation seen in the proximal left internal carotid artery consistent with less than 50% diameter stenosis based on ultrasound and Doppler criteria.   Electronically Signed   By: Lupita Raider, M.D.   On: 12/23/2014 13:57   Portable Chest 1 View  12/23/2014   CLINICAL DATA:  69 year old male with difficulty taking full inspiration. Hypertension and seizure disorder. Subsequent encounter.  EXAM: PORTABLE CHEST - 1 VIEW  COMPARISON:  2015/01/11.  FINDINGS: Exam limited by portable technique and patient's habitus.  Cardiomegaly.  Mildly tortuous aorta.  No obvious infiltrate, congestive heart failure or pneumothorax.  IMPRESSION: Cardiomegaly  Calcified tortuous aorta  No obvious infiltrate, congestive heart failure or pneumothorax.  Evaluation limited by portable technique and patient's habitus.    Electronically Signed   By: Lacy Duverney M.D.   On: 12/23/2014 07:31      Impression / Plan:   Lucas Ortega is a 69 y.o. y/o male with acute CVA.  I suspect his acute dysphagia, dysphasia, increased secretions & inability to clear secretions is due to central etiology given recent CVA.  If symptoms persist, given his chronic ongoing alcoholism, would consider UGI with Ba pill study versus endoscopic evaluation as he is at high risk for complications with sedation or dilation.  I will discuss his care with Dr Servando Snare.  Thank you for involving me in the care of this patient.      LOS:  1 day   Lorenza BurtonJONES, Hurshell Dino, NP  12/23/2014, 3:44 PM [facility]  [address] [place], [place] [address] Phone: [phone] Fax : [phone]

## 2014-12-23 NOTE — Progress Notes (Signed)
MRI and Ultrasound of carotid results md acknowledged results; gave orders to administer 81 mg aspirin daily; will continue to monitor and assess pt

## 2014-12-23 NOTE — Consult Note (Signed)
Reason for Consult: stroke Referring Physician: Dr. Lamont Snowball Lucas Ortega is an 69 y.o. male.  HPI: seen at request of Dr. Benjie Karvonen for stroke;  69 yo RHD M presents to [facility] secondary to sudden onset of L sided weakness.  Onset time was unknown so pt was not tPA candidate.  Pt was intoxicated when he was admitted which complicated picture.  Today, EtOH has warn off but pt still has some L sided weakness.  There was a seizure reported in the ER but no more since being here.  Pt does not have prior history of seizures either.  Pt states that he has a cough now but otherwise feels a little better.  Past Medical History  Diagnosis Date  . Hypertension   . Hyperlipidemia   . Arthritis   . Gout     History reviewed. No pertinent past surgical history.  Family History  Problem Relation Age of Onset  . Family history unknown: Yes    Social History:  reports that he has never smoked. He does not have any smokeless tobacco history on file. He reports that he drinks alcohol. He reports that he does not use illicit drugs.  Allergies:  Allergies  Allergen Reactions  . Penicillins Other (See Comments)    Unknown reaction    Medications: personally reviewed by me as in chart   Results for orders placed or performed during the hospital encounter of 12/21/2014 (from the past 48 hour(s))  CBC with Differential     Status: Abnormal   Collection Time: 01/06/2015  2:44 PM  Result Value Ref Range   WBC 27.6 (H) 3.8 - 10.6 K/uL   RBC 4.61 4.40 - 5.90 MIL/uL   Hemoglobin 11.8 (L) 13.0 - 18.0 g/dL   HCT 37.8 (L) 40.0 - 52.0 %   MCV 82.1 80.0 - 100.0 fL   MCH 25.6 (L) 26.0 - 34.0 pg   MCHC 31.2 (L) 32.0 - 36.0 g/dL   RDW 18.5 (H) 11.5 - 14.5 %   Platelets 283 150 - 440 K/uL   Neutrophils Relative % 85 %   Lymphocytes Relative 7 %   Monocytes Relative 8 %   Eosinophils Relative 0 %   Basophils Relative 0 %   Neutro Abs 23.5 (H) 1.4 - 6.5 K/uL   Lymphs Abs 1.9 1.0 - 3.6 K/uL   Monocytes Absolute 2.2 (H)  0.2 - 1.0 K/uL   Eosinophils Absolute 0.0 0 - 0.7 K/uL   Basophils Absolute 0.0 0 - 0.1 K/uL  Comprehensive metabolic panel     Status: Abnormal   Collection Time: 12/28/2014  2:44 PM  Result Value Ref Range   Sodium 121 (L) 135 - 145 mmol/L   Potassium 2.6 (LL) 3.5 - 5.1 mmol/L    Comment: POTASSIUM CRITICAL RESULT CALLED TO, READ BACK BY AND VERIFIED WITH CALLED TO DONALD SWEENEY @ 1554 ON 12/21/2014 CAF    Chloride 76 (L) 101 - 111 mmol/L   CO2 21 (L) 22 - 32 mmol/L   Glucose, Bld 141 (H) 65 - 99 mg/dL   BUN <5 (L) 6 - 20 mg/dL   Creatinine, Ser 1.21 0.61 - 1.24 mg/dL   Calcium 8.2 (L) 8.9 - 10.3 mg/dL   Total Protein 6.9 6.5 - 8.1 g/dL   Albumin 4.0 3.5 - 5.0 g/dL   AST 128 (H) 15 - 41 U/L   ALT 32 17 - 63 U/L   Alkaline Phosphatase 80 38 - 126 U/L   Total Bilirubin 1.7 (  H) 0.3 - 1.2 mg/dL   GFR calc non Af Amer 59 (L) >60 mL/min   GFR calc Af Amer >60 >60 mL/min    Comment: (NOTE) The eGFR has been calculated using the CKD EPI equation. This calculation has not been validated in all clinical situations. eGFR's persistently <60 mL/min signify possible Chronic Kidney Disease.    Anion gap 24 (H) 5 - 15  Troponin I     Status: Abnormal   Collection Time: 12/24/2014  2:44 PM  Result Value Ref Range   Troponin I 0.04 (H) <0.031 ng/mL    Comment: RESULT CALLED TO, READ BACK BY AND VERIFIED WITH: JENNIFER INGERSOLL AT 1814 12/20/2014 BY CAF   Lactic acid, plasma     Status: Abnormal   Collection Time: 12/15/2014  2:44 PM  Result Value Ref Range   Lactic Acid, Venous 16.4 (HH) 0.5 - 2.0 mmol/L    Comment: LACTIC ACID CRITICAL RESULT CALLED TO, READ BACK BY AND VERIFIED WITH CALLED TO JENNIFER INGERSOLL @ 1642 ON 12/26/2014 CAF   Blood culture (routine x 2)     Status: None (Preliminary result)   Collection Time: 12/30/2014  2:44 PM  Result Value Ref Range   Specimen Description BLOOD    Special Requests BLOOD    Culture NO GROWTH < 24 HOURS    Report Status PENDING   Ethanol      Status: None   Collection Time: 12/21/2014  2:44 PM  Result Value Ref Range   Alcohol, Ethyl (B) <5 <5 mg/dL    Comment:        LOWEST DETECTABLE LIMIT FOR SERUM ALCOHOL IS 11 mg/dL FOR MEDICAL PURPOSES ONLY   Blood culture (routine x 2)     Status: None (Preliminary result)   Collection Time: 01/04/2015  3:02 PM  Result Value Ref Range   Specimen Description BLOOD    Special Requests BLOOD    Culture NO GROWTH < 24 HOURS    Report Status PENDING   Urine Drug Screen, Qualitative [facility])     Status: None   Collection Time: 12/18/2014  3:08 PM  Result Value Ref Range   Tricyclic, Ur Screen NONE DETECTED NONE DETECTED   Amphetamines, Ur Screen NONE DETECTED NONE DETECTED   MDMA (Ecstasy)Ur Screen NONE DETECTED NONE DETECTED   Cocaine Metabolite,Ur [place] NONE DETECTED NONE DETECTED   Opiate, Ur Screen NONE DETECTED NONE DETECTED   Phencyclidine (PCP) Ur S NONE DETECTED NONE DETECTED   Cannabinoid 50 Ng, Ur [place] NONE DETECTED NONE DETECTED   Barbiturates, Ur Screen NONE DETECTED NONE DETECTED   Benzodiazepine, Ur Scrn NONE DETECTED NONE DETECTED   Methadone Scn, Ur NONE DETECTED NONE DETECTED    Comment: (NOTE) 409  Tricyclics, urine               Cutoff 1000 ng/mL 200  Amphetamines, urine             Cutoff 1000 ng/mL 300  MDMA (Ecstasy), urine           Cutoff 500 ng/mL 400  Cocaine Metabolite, urine       Cutoff 300 ng/mL 500  Opiate, urine                   Cutoff 300 ng/mL 600  Phencyclidine (PCP), urine      Cutoff 25 ng/mL 700  Cannabinoid, urine              Cutoff 50 ng/mL 800  Barbiturates, urine  Cutoff 200 ng/mL 900  Benzodiazepine, urine           Cutoff 200 ng/mL 1000 Methadone, urine                Cutoff 300 ng/mL 1100 1200 The urine drug screen provides only a preliminary, unconfirmed 1300 analytical test result and should not be used for non-medical 1400 purposes. Clinical consideration and professional judgment should 1500 be applied to any positive drug  screen result due to possible 1600 interfering substances. A more specific alternate chemical method 1700 must be used in order to obtain a confirmed analytical result.  1800 Gas chromato graphy / mass spectrometry (GC/MS) is the preferred 1900 confirmatory method.   Urinalysis complete, with microscopic [facility])     Status: Abnormal   Collection Time: 12/28/2014  3:08 PM  Result Value Ref Range   Color, Urine YELLOW (A) YELLOW   APPearance CLEAR (A) CLEAR   Glucose, UA NEGATIVE NEGATIVE mg/dL   Bilirubin Urine NEGATIVE NEGATIVE   Ketones, ur NEGATIVE NEGATIVE mg/dL   Specific Gravity, Urine 1.011 1.005 - 1.030   Hgb urine dipstick 3+ (A) NEGATIVE   pH 5.0 5.0 - 8.0   Protein, ur 30 (A) NEGATIVE mg/dL   Nitrite NEGATIVE NEGATIVE   Leukocytes, UA NEGATIVE NEGATIVE   RBC / HPF 0-5 0 - 5 RBC/hpf   WBC, UA 0-5 0 - 5 WBC/hpf   Bacteria, UA RARE (A) NONE SEEN   Squamous Epithelial / LPF NONE SEEN NONE SEEN   Mucous PRESENT    Hyaline Casts, UA PRESENT   Glucose, capillary     Status: Abnormal   Collection Time: 12/23/2014  7:44 PM  Result Value Ref Range   Glucose-Capillary 125 (H) 65 - 99 mg/dL  Lactic acid, plasma     Status: Abnormal   Collection Time: 12/11/2014  8:08 PM  Result Value Ref Range   Lactic Acid, Venous 5.1 (HH) 0.5 - 2.0 mmol/L    Comment: CRITICAL RESULT CALLED TO, READ BACK BY AND VERIFIED WITH: RESULT REPEATED AND VERIFIED C/LIBBY COBB AT 2120 12/14/2014.CAF/PMH   Lactic acid, plasma     Status: None   Collection Time: 01/04/2015 11:44 PM  Result Value Ref Range   Lactic Acid, Venous 1.9 0.5 - 2.0 mmol/L  Comprehensive metabolic panel     Status: Abnormal   Collection Time: 12/23/14  4:08 AM  Result Value Ref Range   Sodium 135 135 - 145 mmol/L   Potassium 2.6 (LL) 3.5 - 5.1 mmol/L    Comment: CRITICAL RESULT CALLED TO, READ BACK BY AND VERIFIED WITH RESULTS VERIFIED BY REPEAT TESTING C/LIBBY COBB AT 0457 12/23/14.PMH    Chloride 94 (L) 101 - 111 mmol/L   CO2 31  22 - 32 mmol/L   Glucose, Bld 101 (H) 65 - 99 mg/dL   BUN <5 (L) 6 - 20 mg/dL   Creatinine, Ser 1.09 0.61 - 1.24 mg/dL   Calcium 8.1 (L) 8.9 - 10.3 mg/dL   Total Protein 6.5 6.5 - 8.1 g/dL   Albumin 3.6 3.5 - 5.0 g/dL   AST 107 (H) 15 - 41 U/L   ALT 29 17 - 63 U/L   Alkaline Phosphatase 67 38 - 126 U/L   Total Bilirubin 1.4 (H) 0.3 - 1.2 mg/dL   GFR calc non Af Amer >60 >60 mL/min   GFR calc Af Amer >60 >60 mL/min    Comment: (NOTE) The eGFR has been calculated using the CKD EPI equation. This  calculation has not been validated in all clinical situations. eGFR's persistently <60 mL/min signify possible Chronic Kidney Disease.    Anion gap 10 5 - 15  CBC     Status: Abnormal   Collection Time: 12/23/14  4:08 AM  Result Value Ref Range   WBC 14.3 (H) 3.8 - 10.6 K/uL   RBC 4.25 (L) 4.40 - 5.90 MIL/uL   Hemoglobin 10.9 (L) 13.0 - 18.0 g/dL   HCT 34.0 (L) 40.0 - 52.0 %   MCV 80.0 80.0 - 100.0 fL   MCH 25.7 (L) 26.0 - 34.0 pg   MCHC 32.1 32.0 - 36.0 g/dL   RDW 18.4 (H) 11.5 - 14.5 %   Platelets 267 150 - 440 K/uL  Protime-INR     Status: None   Collection Time: 12/23/14  4:08 AM  Result Value Ref Range   Prothrombin Time 14.3 11.4 - 15.0 seconds   INR 1.09   Magnesium     Status: None   Collection Time: 12/23/14  4:08 AM  Result Value Ref Range   Magnesium 1.8 1.7 - 2.4 mg/dL  Phosphorus     Status: None   Collection Time: 12/23/14  4:08 AM  Result Value Ref Range   Phosphorus 2.9 2.5 - 4.6 mg/dL  Glucose, capillary     Status: Abnormal   Collection Time: 12/23/14  7:50 AM  Result Value Ref Range   Glucose-Capillary 123 (H) 65 - 99 mg/dL  Magnesium     Status: None   Collection Time: 12/23/14  2:03 PM  Result Value Ref Range   Magnesium 2.1 1.7 - 2.4 mg/dL  Potassium     Status: Abnormal   Collection Time: 12/23/14  2:03 PM  Result Value Ref Range   Potassium 3.0 (L) 3.5 - 5.1 mmol/L    Dg Chest 2 View  12/18/2014   CLINICAL DATA:  Possible aspiration.  Abnormal  lung sounds.  EXAM: CHEST  2 VIEW  COMPARISON:  None.  FINDINGS: The heart size and mediastinal contours are within normal limits. Both lungs are clear. No pneumothorax or pleural effusion is noted. The visualized skeletal structures are unremarkable.  IMPRESSION: No active cardiopulmonary disease.   Electronically Signed   By: Marijo Conception, M.D.   On: 12/12/2014 15:02   Ct Head (brain) Wo Contrast  12/21/2014   CLINICAL DATA:  Unresponsive, left-sided facial droop.  EXAM: CT HEAD WITHOUT CONTRAST  TECHNIQUE: Contiguous axial images were obtained from the base of the skull through the vertex without intravenous contrast.  COMPARISON:  None.  FINDINGS: Bony calvarium appears intact. Minimal diffuse cortical atrophy is noted. Mild chronic ischemic white matter disease is noted. No mass effect or midline shift is noted. Ventricular size is within normal limits. There is no evidence of mass lesion, hemorrhage or acute infarction.  IMPRESSION: Minimal diffuse cortical atrophy. Mild chronic ischemic white matter disease. No acute intracranial abnormality seen. These results were called by telephone at the time of interpretation on 12/24/2014 at 2:42 pm to Dr. Kerman Passey, who verbally acknowledged these results.   Electronically Signed   By: Marijo Conception, M.D.   On: 12/20/2014 14:44   Mr Brain Wo Contrast  12/23/2014   CLINICAL DATA:  Left-sided weakness and left facial droop. Acute onset.  EXAM: MRI HEAD WITHOUT CONTRAST  TECHNIQUE: Multiplanar, multiecho pulse sequences of the brain and surrounding structures were obtained without intravenous contrast.  COMPARISON:  Head CT 12/21/2014  FINDINGS: There are a cluster of punctate  acute infarctions within the right midbrain. No other acute infarction. No focal cerebellar abnormality. The cerebral hemispheres show mild age related volume loss and minimal small vessel change of the white matter. No large vessel territory infarction. No mass lesion, hemorrhage,  hydrocephalus or extra-axial collection. No pituitary mass. There are changes of mucosal inflammation affecting the left maxillary sinus.  IMPRESSION: Cluster of punctate acute infarctions in the right midbrain.  Chronic small-vessel ischemic changes of the cerebral hemispheric white matter.   Electronically Signed   By: Nelson Chimes M.D.   On: 12/23/2014 12:47   US Carotid Bilateral  12/23/2014   CLINICAL DATA:  Cerebrovascular accident.  EXAM: BILATERAL CAROTID DUPLEX ULTRASOUND  TECHNIQUE: Pearline Cables scale imaging, color Doppler and duplex ultrasound were performed of bilateral carotid and vertebral arteries in the neck.  COMPARISON:  None.  FINDINGS: Criteria: Quantification of carotid stenosis is based on velocity parameters that correlate the residual internal carotid diameter with NASCET-based stenosis levels, using the diameter of the distal internal carotid lumen as the denominator for stenosis measurement.  The following velocity measurements were obtained:  RIGHT  ICA:  129/38 cm/sec  CCA:  56/31 cm/sec  SYSTOLIC ICA/CCA RATIO:  4.97  DIASTOLIC ICA/CCA RATIO:  0.26  ECA:  182 cm/sec  LEFT  ICA:  78/30 cm/sec  CCA:  378/58 cm/sec  SYSTOLIC ICA/CCA RATIO:  8.50  DIASTOLIC ICA/CCA RATIO:  2.77  ECA:  95 cm/sec  RIGHT CAROTID ARTERY: Moderate eccentric plaque formation is noted in the distal right common carotid artery and carotid bulb. Mild plaque formation is noted in the proximal right internal carotid artery. This is consistent with less than 50% diameter stenosis based on ultrasound and Doppler criteria.  RIGHT VERTEBRAL ARTERY:  Not visualized.  LEFT CAROTID ARTERY: Moderate irregular plaque formation is noted in the left carotid bulb with mild plaque formation seen in the proximal left internal carotid artery. This is consistent with less than 50% diameter stenosis based on ultrasound and Doppler criteria.  LEFT VERTEBRAL ARTERY:  Not visualized.  IMPRESSION: Moderate eccentric plaque formation is noted  in the distal right common carotid artery right carotid bulb, with only mild stenosis noted in the proximal right internal carotid artery. This is consistent with less than 50% diameter stenosis in the proximal right internal carotid artery based on ultrasound and Doppler criteria.  Moderate irregular plaque formation is noted in the left carotid bulb with mild plaque formation seen in the proximal left internal carotid artery consistent with less than 50% diameter stenosis based on ultrasound and Doppler criteria.   Electronically Signed   By: Marijo Conception, M.D.   On: 12/23/2014 13:57   Portable Chest 1 View  12/23/2014   CLINICAL DATA:  69 year old male with difficulty taking full inspiration. Hypertension and seizure disorder. Subsequent encounter.  EXAM: PORTABLE CHEST - 1 VIEW  COMPARISON:  12/21/2014.  FINDINGS: Exam limited by portable technique and patient's habitus.  Cardiomegaly.  Mildly tortuous aorta.  No obvious infiltrate, congestive heart failure or pneumothorax.  IMPRESSION: Cardiomegaly  Calcified tortuous aorta  No obvious infiltrate, congestive heart failure or pneumothorax.  Evaluation limited by portable technique and patient's habitus.   Electronically Signed   By: Genia Del M.D.   On: 12/23/2014 07:31    Review of Systems  Unable to perform ROS: medical condition  Constitutional: Negative.   Eyes: Negative.   Respiratory: Positive for cough and sputum production.   Cardiovascular: Negative.   Gastrointestinal: Negative.   Musculoskeletal: Negative.  Skin: Negative.   Neurological: Positive for focal weakness. Negative for dizziness, sensory change, speech change, loss of consciousness and headaches.   Blood pressure 133/112, pulse 135, temperature 98.5 F (36.9 C), temperature source Oral, resp. rate 24, height _0  (1.676 m), weight 123.8 kg (272 lb 14.9 oz), SpO2 100 %. Physical Exam  Constitutional: He appears well-developed and well-nourished. He appears  distressed.  HENT:  Head: Normocephalic.  Nose: Nose normal.  Mouth/Throat: Oropharyngeal exudate present.  Eyes: Conjunctivae and EOM are normal. Pupils are equal, round, and reactive to light.  Neck: Normal range of motion. Neck supple.  Cardiovascular: Normal rate, regular rhythm and normal heart sounds.   Respiratory: No respiratory distress. He has wheezes. He has rales.  GI: Soft. Bowel sounds are normal.  Musculoskeletal: Normal range of motion.  Neurological: He is alert. He has normal reflexes. No cranial nerve deficit or sensory deficit.  Severe dysarthria, nl language, A+Ox3 L droop, tongue midline Mild L weakness with dysmmetria Nl sensation  Skin: Skin is warm and dry.    Assessment/Plan: 1.  R pontine infarct-  Mildly symptomatic except for swallowing, etiology is small vessel disease from uncontrolled HTN and DM.  Pt also drinks which puts him at risk for this. 2.  Seizure-  Provoked by EtOH withdrawal 3.  EtOH abuse-  Pt tries to downplay the amount of EtOH but does admit that he has a problem -  Continue ASA 54m daily -  Continue Keppra [id number] 3 days then stop -  Check Hem A1c and adjust medications for goal < 7 -  Needs good BP control which can start now, goal < 130/80 -  Check LDL and adjust for < 70 -  Watch for aspiration pneumonia and pt may need PEG if swallowing not improved by end of the week -  Pt counseled about EtOH usage -  Continue CIWA protocol with thiamine and folate replacement -  Will sign off, please call with questions -  [facility]  SMCottonwoodMAMunford/16/2016, 5:58 PM

## 2014-12-23 NOTE — Progress Notes (Signed)
RN notified Dr. Anne HahnWillis that patient remains aggitated even after giving ativan 2mg  around 2030. Patient continues to try and get out of bed. Jessica NT has been at bedside for over an hour trying to keep patient calmer and prevent him from exiting bed. Dr. Anne HahnWillis stated "i will order some haldol and PRN sitter."  Heavenlee Maiorana B

## 2014-12-23 NOTE — Progress Notes (Signed)
Pt remains alert and oriented with left sided facial droop Dr Katrinka BlazingSmith and Dr Juliene PinaMody aware; pt having copious amounts of oral secretions glycopyralate administered per Dr Camillia HerterMody's orders; Dr Katrinka BlazingSmith aware of MRI results he stated he would assess pt; vss; adequate uop via foley; sinus rhythm on cardiac monitor; speech evaluation performed meds to be crushed and administered in applesauce; pt currently stepdown status; potassium low during shift pharmacy placing replacement orders; pt currently NPO due to risk of aspiration; pts. family updated about plan of care and questions answered will continue to monitor and assess pt

## 2014-12-23 NOTE — Progress Notes (Signed)
[facility] - [place] [facility]   PATIENT NAME: Lucas Ortega    MR#:  [id number]  DATE OF BIRTH:  09-08-1945  SUBJECTIVE:  Patient continues to have slurred speech. He seems a bit confused.  REVIEW OF SYSTEMS:    Review of Systems  Constitutional: Negative for fever, chills and weight loss.  Eyes: Negative for double vision.  Respiratory: Positive for cough and sputum production. Negative for shortness of breath and wheezing.   Gastrointestinal: Negative for nausea, vomiting and abdominal pain.  Skin: Negative for rash.  Neurological: Positive for tremors and speech change. Negative for headaches.  Psychiatric/Behavioral: Negative for suicidal ideas.    Tolerating Diet: Nothing by mouth due to thick secretions.      DRUG ALLERGIES:   Allergies  Allergen Reactions  . Penicillins Other (See Comments)    Unknown reaction    VITALS:  Blood pressure 129/72, pulse 98, temperature 98.5 F (36.9 C), temperature source Oral, resp. rate 21, height 5\' 6"  (1.676 m), weight 123.8 kg (272 lb 14.9 oz), SpO2 98 %.  PHYSICAL EXAMINATION:   Physical Exam  Constitutional: He is well-developed, well-nourished, and in no distress. No distress.  HENT:  Head: Normocephalic and atraumatic.  Eyes: Pupils are equal, round, and reactive to light.  Neck: Normal range of motion. Neck supple. No JVD present. No tracheal deviation present.  Cardiovascular: Normal rate, regular rhythm and normal heart sounds.  Exam reveals no gallop.   No murmur heard. Pulmonary/Chest: Effort normal. No respiratory distress. He has no wheezes.  secretions  Abdominal: Bowel sounds are normal. He exhibits no distension. There is no tenderness.  Neurological:  Slurred speech slight facial droop  Skin: He is not diaphoretic.      LABORATORY PANEL:   CBC  Recent Labs Lab 12/23/14 0408  WBC 14.3*  HGB 10.9*  HCT 34.0*  PLT 267    ------------------------------------------------------------------------------------------------------------------  Chemistries   Recent Labs Lab 12/23/14 0408  NA 135  K 2.6*  CL 94*  CO2 31  GLUCOSE 101*  BUN <5*  CREATININE 1.09  CALCIUM 8.1*  MG 1.8  AST 107*  ALT 29  ALKPHOS 67  BILITOT 1.4*   ------------------------------------------------------------------------------------------------------------------  Cardiac Enzymes  Recent Labs Lab Dec 03, 2014 1444  TROPONINI 0.04*   ------------------------------------------------------------------------------------------------------------------  RADIOLOGY:  Dg Chest 2 View  10-Aug-2014   CLINICAL DATA:  Possible aspiration.  Abnormal lung sounds.  EXAM: CHEST  2 VIEW  COMPARISON:  None.  FINDINGS: The heart size and mediastinal contours are within normal limits. Both lungs are clear. No pneumothorax or pleural effusion is noted. The visualized skeletal structures are unremarkable.  IMPRESSION: No active cardiopulmonary disease.   Electronically Signed   By: Lupita RaiderJames  Green Jr, M.D.   On: 002-Jan-2016 15:02   Ct Head (brain) Wo Contrast  10-Aug-2014   IMPRESSION: Minimal diffuse cortical atrophy. Mild chronic ischemic white matter disease. No acute intracranial abnormality seen. These results were called by telephone at the time of interpretation on 10-Aug-2014 at 2:42 pm to Dr. Lenard LancePaduchowski, who verbally acknowledged these results.   Electronically Signed   By: Lupita RaiderJames  Green Jr, M.D.   On: 002-Jan-2016 14:44   Portable Chest 1 View  12/23/2014   IMPRESSION: Cardiomegaly  Calcified tortuous aorta  No obvious infiltrate, congestive heart failure or pneumothorax.  Evaluation limited by portable technique and patient's habitus.   Electronically Signed   By: Lacy DuverneySteven  Olson M.D.   On: 12/23/2014 07:31     ASSESSMENT AND  PLAN:    This is 69 year old male with a history of hypertension and gout he was found by his family with left facial  droop and left-sided weakness. He also has been drinking EtOH heavily. He had a seizure in the ER.  1. Stroke: Patient presented with slurred speech and left facial droop along with left-sided weakness. He continues to have slurred speech. Unclear this is related to his alcohol drinking or to a stroke. We will continue with stroke workup including an MRI and echocardiogram. His carotid ultrasound did not show evidence of hemodynamic stenosis. I am concerned about his swallowing abilities and therefore have placed nothing by mouth orders. I've also place a consultation for speech. He will need aspirin per rectum for now. We will continue with neuro checks. Patient was placed on antibiotics. I do not see a reason for antibiotics. He has no sign of infection. I will discontinue Levaquin.  2. Seizure: I suspect this is related to alcohol drinking. I spoke with the patient's sister who said that the patient does drink heavily. Neurology has been consultative for further evaluation and management. For now I will continue Keppra and seizure precautions.   3. EtOH dependence: Patient is on CIWA protocol. I will continue to monitor this.   4.. Electrolyte abnormalities: I have consulted pharmacy for assistance with electrolyte protocol.  5. Essential hypertension: Patient is on lisinopril and Norvasc at home and due to his current speech and secretion issue I am holding these by mouth medications. I will prescribe hydralazine when necessary.     Management plans discussed with the patient's sister who is in agreement.  CODE STATUS FULL  TOTAL TIME TAKING CARE OF THIS PATIENT: 35 minutes.   POSSIBLE D/C  3 DAYS, DEPENDING ON CLINICAL CONDITION.   [name] M.D on 12/23/2014 at 11:20 AM  Between 7am to 6pm - Pager - [phone] After 6pm go to [email or web address] - password [email or web address] [facility]  [facility]  Office  [phone]  CC: Primary care physician; Ashley MarinerGONZALEZ, CHRISTINA MARIE, MD

## 2014-12-23 NOTE — Plan of Care (Signed)
Problem: SLP Dysphagia Goals Goal: Misc Dysphagia Goal Pt will safely tolerate po diet of least restrictive consistency w/ no overt s/s of aspiration noted by Staff/pt/family x3 sessions.    

## 2014-12-23 NOTE — Evaluation (Signed)
Clinical/Bedside Swallow Evaluation Patient Details  Name: Lucas Ortega MRN: [id number] Date of Birth: 03-24-1946  Today's Date: 12/23/2014 Time: SLP Start Time (ACUTE ONLY): 1110 SLP Stop Time (ACUTE ONLY): 1210 SLP Time Calculation (min) (ACUTE ONLY): 60 min  Past Medical History:  Past Medical History  Diagnosis Date  . Hypertension   . Hyperlipidemia   . Arthritis   . Gout    Past Surgical History: History reviewed. No pertinent past surgical history. HPI:  pt's Sister arrived and stated pt "spits phelgme at home a whole lot more"; she described pt expectorating 2-3 containers full at home daily. She also endorsed s/s of reflux and indigestion. Pt is also a heavy ETOH abuser. Pt presented w/ mumbled speech at times; missing most dentition.    Assessment / Plan / Recommendation Clinical Impression  Pt presented w/ no immediate overt s/s of aspiration w/ few trials presented, however, pt endorsed and presented w/ s/s of Esophageal discomfort and dysmotility including belching and hiccups. Pt began coughing and expectorating white, frothy phelgm at the end of presentation of po trials. He did not regurgitate, but he did acknowledge he regurgitated at home "sometimes". Pt appeared to safely swallow the few po trials given w/ immediate coughing, no changes in HR/RR/O2 sats, and no decline in respiratory status. Conitnued trials were halted d/t pt leaving the floor and d/t pt's increased phlegm production. Pt demo. no significant oral phase deficits w/ trials given. Pt was able feed self w/ min. support. Pt appears at min. increase risk for aspiration moreso from Esophageal dysmotility and regurgitation. Unsure if related to his ETOH consumption. ST will continue to further assess safety w/ po consistencies.    Aspiration Risk  Mild    Diet Recommendation NPO (w/ Oral care)   Medication Administration: Crushed with puree Compensations: Small sips/bites;Slow rate    Other  Recommendations  Recommended Consults: Consider GI evaluation Oral Care Recommendations: Oral care BID   Follow Up Recommendations       Frequency and Duration  3-5x 1 week   Pertinent Vitals/Pain denied    SLP Swallow Goals  see care plan   Swallow Study Prior Functional Status   pt lived at home; ETOH abuse    General Date of Onset: 12/23/14 Other Pertinent Information: pt's Sister arrived and stated pt "spits phelgme at home a whole lot more"; she described pt expectorating 2-3 containers full at home daily. She also endorsed s/s of reflux and indigestion. Pt is also a heavy ETOH abuser. Pt presented w/ mumbled speech at times; missing most dentition.  Type of Study: Bedside swallow evaluation Previous Swallow Assessment: none reported Diet Prior to this Study: Regular Temperature Spikes Noted: No Respiratory Status: Supplemental O2 delivered via (comment) History of Recent Intubation: No Behavior/Cognition: Alert;Cooperative;Requires cueing Oral Cavity - Dentition: Missing dentition Self-Feeding Abilities: Needs assist;Needs set up Patient Positioning: Upright in bed Baseline Vocal Quality: Normal Volitional Cough: Strong Volitional Swallow: Able to elicit    Oral/Motor/Sensory Function Overall Oral Motor/Sensory Function: Impaired Labial ROM: Reduced left;Other (Comment) (min. ) Labial Symmetry: Within Functional Limits Labial Strength: Reduced (min. Left) Labial Sensation: Within Functional Limits Lingual ROM: Within Functional Limits (grossly) Lingual Symmetry: Within Functional Limits Lingual Strength: Within Functional Limits Facial ROM: Reduced left (min. ) Facial Symmetry: Left droop Facial Strength: Within Functional Limits Velum: Within Functional Limits Mandible: Within Functional Limits   Ice Chips Ice chips: Within functional limits Presentation: Spoon Other Comments:  (pt's sputum exacerbation continued but did not increase w/  presentation of ice chips)   Thin  Liquid Thin Liquid: Within functional limits Presentation: Spoon;Cup Other Comments:  (cup x2; tsp x5.  Sputum exacerbation did not increase as trials were given.)    Nectar Thick Nectar Thick Liquid: Not tested   Honey Thick Honey Thick Liquid: Not tested   Puree Puree: Within functional limits Presentation: Spoon Other Comments:  (x3 small tsp. Sputum exacerbation did not increase as trials were given)   Solid   GO    Solid: Not tested       Watson,Katherine 12/23/2014,1:55 PM

## 2014-12-24 ENCOUNTER — Inpatient Hospital Stay: Payer: Medicare Other

## 2014-12-24 DIAGNOSIS — F1010 Alcohol abuse, uncomplicated: Secondary | ICD-10-CM

## 2014-12-24 DIAGNOSIS — M129 Arthropathy, unspecified: Secondary | ICD-10-CM

## 2014-12-24 DIAGNOSIS — E785 Hyperlipidemia, unspecified: Secondary | ICD-10-CM

## 2014-12-24 DIAGNOSIS — Z515 Encounter for palliative care: Secondary | ICD-10-CM

## 2014-12-24 DIAGNOSIS — E119 Type 2 diabetes mellitus without complications: Secondary | ICD-10-CM

## 2014-12-24 DIAGNOSIS — I10 Essential (primary) hypertension: Secondary | ICD-10-CM

## 2014-12-24 DIAGNOSIS — M109 Gout, unspecified: Secondary | ICD-10-CM

## 2014-12-24 DIAGNOSIS — I639 Cerebral infarction, unspecified: Secondary | ICD-10-CM | POA: Diagnosis present

## 2014-12-24 DIAGNOSIS — D72829 Elevated white blood cell count, unspecified: Secondary | ICD-10-CM

## 2014-12-24 LAB — GLUCOSE, CAPILLARY: Glucose-Capillary: 119 mg/dL — ABNORMAL HIGH (ref 65–99)

## 2014-12-24 LAB — BLOOD GAS, ARTERIAL
Acid-Base Excess: 3.3 mmol/L — ABNORMAL HIGH (ref 0.0–3.0)
Allens test (pass/fail): POSITIVE — AB
Bicarbonate: 27.8 mEq/L (ref 21.0–28.0)
FIO2: 0.32 %
O2 SAT: 95.4 %
PH ART: 7.44 (ref 7.350–7.450)
Patient temperature: 37
pCO2 arterial: 41 mmHg (ref 32.0–48.0)
pO2, Arterial: 75 mmHg — ABNORMAL LOW (ref 83.0–108.0)

## 2014-12-24 LAB — BASIC METABOLIC PANEL
ANION GAP: 7 (ref 5–15)
CHLORIDE: 104 mmol/L (ref 101–111)
CO2: 28 mmol/L (ref 22–32)
CREATININE: 1.09 mg/dL (ref 0.61–1.24)
Calcium: 8 mg/dL — ABNORMAL LOW (ref 8.9–10.3)
GFR calc non Af Amer: >60 mL/min (ref >60)
Glucose, Bld: 127 mg/dL — ABNORMAL HIGH (ref 65–99)
Potassium: 4.2 mmol/L (ref 3.5–5.1)
Sodium: 139 mmol/L (ref 135–145)

## 2014-12-24 LAB — MAGNESIUM: MAGNESIUM: 2 mg/dL (ref 1.7–2.4)

## 2014-12-24 LAB — PHOSPHORUS: Phosphorus: 2.6 mg/dL (ref 2.5–4.6)

## 2014-12-24 MED ORDER — CETIRIZINE HCL 5 MG/5ML PO SYRP
5.0000 mg | ORAL_SOLUTION | Freq: Two times a day (BID) | ORAL | Status: DC
  Administered 2014-12-25: 5 mg via ORAL
  Filled 2014-12-24 (×11): qty 5

## 2014-12-24 MED ORDER — GLYCOPYRROLATE 0.2 MG/ML IJ SOLN
0.2000 mg | INTRAMUSCULAR | Status: DC
  Administered 2014-12-24 – 2014-12-27 (×17): 0.2 mg via INTRAVENOUS
  Filled 2014-12-24 (×17): qty 1

## 2014-12-24 MED ORDER — DEXMEDETOMIDINE HCL IN NACL 400 MCG/100ML IV SOLN
0.4000 ug/kg/h | INTRAVENOUS | Status: DC
  Administered 2014-12-24 – 2014-12-25 (×2): 0.4 ug/kg/h via INTRAVENOUS
  Administered 2014-12-25 (×2): 0.9 ug/kg/h via INTRAVENOUS
  Administered 2014-12-25: 0.402 ug/kg/h via INTRAVENOUS
  Administered 2014-12-25: 0.901 ug/kg/h via INTRAVENOUS
  Administered 2014-12-26: 0.9 ug/kg/h via INTRAVENOUS
  Administered 2014-12-26: 0.901 ug/kg/h via INTRAVENOUS
  Filled 2014-12-24 (×8): qty 100

## 2014-12-24 NOTE — Progress Notes (Signed)
Appreciate neurology input.  Will be on standby if PEG is needed.  Lorenza BurtonKandice Guenevere Roorda, FNP-BC, MSN, RN [facility]  [address] [place], [place] [address] Phone: [phone] Fax : [phone]

## 2014-12-24 NOTE — Progress Notes (Signed)
Lethargic all night after giving haldol for aggitation but this morning patient is more alert and asking for water, also trying to get out of bed again and now being cooperative at times. Personal alarm and bed alarm on. Sinus tach low 100's per cardiac monitor. Garbled speech and patient still has gurgling to his throat. Infrequent cough. Report given to Kimberling CityLydia, RN who is now taking over patient's care.  Siriah Treat B

## 2014-12-24 NOTE — Progress Notes (Signed)
PHARMACY - CRITICAL CARE PROGRESS NOTE  Pharmacy Consult for Electrolyte Management.   Indication: ICU Status   Allergies  Allergen Reactions  . Penicillins Other (See Comments)    Unknown reaction    Patient Measurements: Height: 5\' 6"  (167.6 cm) Weight: 272 lb 14.9 oz (123.8 kg) IBW/kg (Calculated) : 63.8   Vital Signs: Temp: 98.9 F (37.2 C) (05/16 2000) Temp Source: Axillary (05/16 2000) BP: 118/61 mmHg (05/17 0000) Pulse Rate: 94 (05/17 0000) Intake/Output from previous day: 05/16 0701 - 05/17 0700 In: 540 [I.V.:540] Out: 800 [Urine:800] Intake/Output from this shift: Total I/O In: 500 [I.V.:500] Out: -  Vent settings for last 24 hours:    Labs:  Recent Labs  12/27/2014 1444 12/23/14 0408 12/23/14 1403  WBC 27.6* 14.3*  --   HGB 11.8* 10.9*  --   HCT 37.8* 34.0*  --   PLT 283 267  --   INR  --  1.09  --   CREATININE 1.21 1.09  --   MG  --  1.8 2.1  PHOS  --  2.9  --   ALBUMIN 4.0 3.6  --   PROT 6.9 6.5  --   AST 128* 107*  --   ALT 32 29  --   ALKPHOS 80 67  --   BILITOT 1.7* 1.4*  --    Estimated Creatinine Clearance: 79.4 mL/min (by C-G formula based on Cr of 1.09).   Recent Labs  12/08/2014 1944 12/23/14 0750  GLUCAP 125* 123*    Microbiology: Recent Results (from the past 720 hour(s))  Blood culture (routine x 2)     Status: None (Preliminary result)   Collection Time: 12/25/2014  2:44 PM  Result Value Ref Range Status   Specimen Description BLOOD  Final   Special Requests BLOOD  Final   Culture NO GROWTH < 24 HOURS  Final   Report Status PENDING  Incomplete  Blood culture (routine x 2)     Status: None (Preliminary result)   Collection Time: 12/09/2014  3:02 PM  Result Value Ref Range Status   Specimen Description BLOOD  Final   Special Requests BLOOD  Final   Culture NO GROWTH < 24 HOURS  Final   Report Status PENDING  Incomplete    Medications:  Scheduled:  . albuterol  2.5 mg Nebulization Q6H  . aspirin  81 mg Oral Daily  .  atorvastatin  80 mg Oral q1800  . docusate sodium  100 mg Oral BID  . glycopyrrolate  0.2 mg Intravenous TID  . heparin  5,000 Units Subcutaneous 3 times per day  . levETIRAcetam  500 mg Intravenous Q12H  . metoprolol  5 mg Intravenous 4 times per day  . pantoprazole (PROTONIX) IV  40 mg Intravenous Q12H  . sodium chloride  3 mL Intravenous Q12H   Infusions:  . 0.9 % NaCl with KCl 40 mEq / L 150 mL/hr at 12/23/14 2300  . banana bag IV 1000 mL 40 mL/hr at 12/23/14 1344    Assessment: 69 yo male ICU patient admitted with CVA.     Plan:  Potassium and magnesium replaced. Patient receiving NS with 40mEq of Potassium at 16350mL/hr. Will continue to monitor electrolytes daily while in ICU. Will replace per protocol.    Pharmacy will continue to monitor and adjust per consult.     Blayden Conwell L 12/24/2014,12:25 AM

## 2014-12-24 NOTE — Plan of Care (Signed)
Problem: Progression Outcomes Goal: Tolerating diet/TF at goal rate Outcome: Not Progressing NPO. ST re-eval tomorrow Goal: Educational plan initiated Outcome: Not Met (add Reason) Patient confused and not able to be educated at this time

## 2014-12-24 NOTE — Progress Notes (Signed)
Speech Therapy Note:  Reviewed chart notes; consulted NSG/MD re: pt's status. Noted Neuro. Note and MRI results. Neuro. Suspects swallowing deficits. Pt continues to present w/ increased upper airway secretions; unsure if related to GI issues as pt's Sister reported such yesterday during BSE (this was relayed to the MD). Pt remains NPO. ST will f/u w/ pt tomorrow; suspect pt would need an objective swallow study to further assess swallow function for safety of oral intake. MD agreed. ST will f/u tomorrow.

## 2014-12-24 NOTE — Progress Notes (Signed)
NEUROLOGY NOTE  S: Pt still having lots of secretions and this was apparently a problem before stroke.  He denies voice changes and states that this is his baseline.  He still wants to eat now.  ROS + for cough otherwise neg x 8 systems include nasal drainage and dyspepsia  O: AFVSS NAD, overweight Normocephalic, oropharynx with thick secretions supple, no lymphadenoopathy Coarse BS B, no wheezing tachycardic, no murmurs  A+Ox3, severe dysarthria PERRLA, EOMI, face symmetric 5/5 B  Labs reviewed by me   Impression: 1. Pontine infarct-  Stable exam except for swallowing issues 2.  Dysphagia-  Existed prior to stroke and likely worsened by stroke;  Unclear etiology  -  HOB 60 degrees -  Increase Robinul to .2mg  q4h -  Add Zyrtec 5mg  BID -  Continue ASA and statin -  Agree with NPO and will likely need PEG -  Would still watch in ICU

## 2014-12-24 NOTE — Progress Notes (Signed)
RN made Dr. Betti Cruzeddy aware that patient is very lethargic. RN notified Dr. Betti Cruzeddy that patient sounds very gurgly and arouses to stimuli but does not follow commands at this time and that RN feels patient needs to be NT suctioned. MD gave order for PRN nasotracheal suction.  Lucas Ortega B

## 2014-12-24 NOTE — Progress Notes (Signed)
[facility] -  [facility]   PATIENT NAME: Lucas Ortega    MR#:  [id number]  DATE OF BIRTH:  07-25-1946  SUBJECTIVE:  She has thick secretions from his nose and mouth. Patient is pretty lethargic. I spoke with the family this morning regarding possible intubation. At this time they are leaning to possible intubation. Patient's ABG does not show an acute need for intubation at this time. However if patient continues to have thick secretions and unable to protect his airway patient may need to be intubated if the wishes remain the same for the family. REVIEW OF SYSTEMS:    Review of Systems  Unable to perform ROS: medical condition    Tolerating Diet: Nothing by mouth due to thick secretions.      DRUG ALLERGIES:   Allergies  Allergen Reactions  . Penicillins Other (See Comments)    Unknown reaction    VITALS:  Blood pressure 162/77, pulse 106, temperature 99.9 F (37.7 C), temperature source Oral, resp. rate 16, height 5\' 6"  (1.676 m), weight 122.5 kg (270 lb 1 oz), SpO2 95 %.  PHYSICAL EXAMINATION:   Physical Exam  Constitutional: No distress.  HENT:  Head: Normocephalic and atraumatic.  Neck: Normal range of motion. Neck supple. No JVD present. No tracheal deviation present.  Cardiovascular: Normal rate, regular rhythm and normal heart sounds.  Exam reveals no gallop.   No murmur heard. Pulmonary/Chest: Effort normal. No respiratory distress (think secretions from nose and mouth). He has no wheezes.  secretions  Abdominal: Bowel sounds are normal. He exhibits no distension. There is no tenderness.  Musculoskeletal: Edema: unable to do good exam but moves all extremities.  Neurological: Cranial nerve deficit: patient with facial droop, unable to do a neuro exam as the patient is lethargic.  Slurred speech slight facial droop  Skin: Skin is warm. No rash noted. He is not diaphoretic.  Psychiatric:  No oriented lethargic        LABORATORY PANEL:   CBC  Recent Labs Lab 12/23/14 0408  WBC 14.3*  HGB 10.9*  HCT 34.0*  PLT 267   ------------------------------------------------------------------------------------------------------------------  Chemistries   Recent Labs Lab 12/23/14 0408  12/24/14 0710  NA 135  --  139  K 2.6*  < > 4.2  CL 94*  --  104  CO2 31  --  28  GLUCOSE 101*  --  127*  BUN <5*  --  <5*  CREATININE 1.09  --  1.09  CALCIUM 8.1*  --  8.0*  MG 1.8  < > 2.0  AST 107*  --   --   ALT 29  --   --   ALKPHOS 67  --   --   BILITOT 1.4*  --   --   < > = values in this interval not displayed. ------------------------------------------------------------------------------------------------------------------  Cardiac Enzymes  Recent Labs Lab 12/09/2014 1444  TROPONINI 0.04*   ------------------------------------------------------------------------------------------------------------------  RADIOLOGY:  Dg Chest 2 View  12/30/2014   CLINICAL DATA:  Possible aspiration.  Abnormal lung sounds.  EXAM: CHEST  2 VIEW  COMPARISON:  None.  FINDINGS: The heart size and mediastinal contours are within normal limits. Both lungs are clear. No pneumothorax or pleural effusion is noted. The visualized skeletal structures are unremarkable.  IMPRESSION: No active cardiopulmonary disease.   Electronically Signed   By: Lupita RaiderJames  Green Jr, M.D.   On: 12/21/2014 15:02   Ct Head (brain) Wo Contrast  12/19/2014   IMPRESSION: Minimal diffuse cortical  atrophy. Mild chronic ischemic white matter disease. No acute intracranial abnormality seen. These results were called by telephone at the time of interpretation on 2015/01/06 at 2:42 pm to Dr. Lenard LancePaduchowski, who verbally acknowledged these results.   Electronically Signed   By: Lupita RaiderJames  Green Jr, M.D.   On: 02016/05/30 14:44   Portable Chest 1 View  12/23/2014   IMPRESSION: Cardiomegaly  Calcified tortuous aorta  No obvious infiltrate, congestive heart failure or  pneumothorax.  Evaluation limited by portable technique and patient's habitus.   Electronically Signed   By: Lacy DuverneySteven  Olson M.D.   On: 12/23/2014 07:31     ASSESSMENT AND PLAN:    This is 69 year old male with a history of hypertension and gout he was found by his family with left facial droop and left-sided weakness. He also has been drinking EtOH heavily. He had a seizure in the ER.  1. Stroke:  Patient has been seen and evaluated by neurology. Patient has a right pontine infarct. Patient is mildly symptomatic except for swallowing issues. The etiology is likely small vessel disease from uncontrolled diabetes and hypertension. She also drinks EtOH which is continuing to a stroke. Patient is currently on neuro checks every 4 hours which we will continue. Patient is nothing by mouth except for medsdue to his thick secretions in mental status. Patient has been seen and evaluated by speech therapy.patient will continue with aspirin and statin.  2. Seizure: patient had a witnessed seizure in the emergency department. I suspect this is related to alcohol drinking. I spoke with the patient's sister who said that the patient does drink heavily. Neurology recommends continuing Keppra 500 mg for 3 days then stop.   3. EtOH dependence: Patient is on CIWA protocol. I will continue to monitor this.   4.. Electrolyte abnormalities: I have consulted pharmacy for assistance with electrolyte protocol.  5. Essential hypertension: Patient is on lisinopril and Norvasc at home and due to his current speech and secretion issue I am holding these by mouth medications. I will prescribe hydralazine when necessary.  6. Respiratory issues: Patient is at high risk for aspiration pneumonia. If he continues to have thick secretions and remains lethargic he will need intubation to protect his airway. I've discussed this with the family. I will consult palliative care for further evaluation and management. Patient's family  would like more guidance.     Management plans discussed with the patient's sister who is in agreement.  CODE STATUS FULL  Critical careTOTAL TIME TAKING CARE OF THIS PATIENT: 40 POSSIBLE D/C  3 DAYS, DEPENDING ON CLINICAL CONDITION.   Yesennia Hirota M.D on 12/24/2014 at 1:57 PM  Between 7am to 6pm - Pager - [phone] After 6pm go to [email or web address] - password [email or web address] [facility]  [facility]  Office  [phone]  CC: Primary care physician; Ashley MarinerGONZALEZ, CHRISTINA MARIE, MD

## 2014-12-24 NOTE — Consult Note (Signed)
Palliative Medicine Inpatient Consult Note   Name: Lucas Ortega Date: 12/24/2014 MRN: [id number]  DOB: [birth year]  Referring Physician: Adrian SaranSital Mody, MD  Palliative Care consult requested for this 69 y.o. male for goals of medical therapy in patient with HTN, HLD, T2DM, gout, arthritis and current ETOH abuse.  Pt presented to ER with altered mental status and L facial droop.  Pt had seizure down in ER.  ER workup significant for leukocytosis and MRI showed R pontine stroke.  Pt currently in CCU.  Family not at bedside.   Pt currently resting in bed with what appears to be copious secretions.  Pt on Robinul.   REVIEW OF SYSTEMS:  Pain: None Dyspnea:  No Nausea/Vomiting:  No All other systems were reviewed and found to be negative  SOCIAL HISTORY:Pt lives with sister Lucas Ortega.  Pt has one daughter. Pt is not married.  reports that he has never smoked. He does not have any smokeless tobacco history on file. He reports that he drinks alcohol. He reports that he does not use illicit drugs.  LEGAL DOCUMENTS:   Advance Directives:  no. Health Care Power of Attorney:  no.  CODE STATUS: Full code  PAST MEDICAL HISTORY: Past Medical History  Diagnosis Date  . Hypertension   . Hyperlipidemia   . Arthritis   . Gout     PAST SURGICAL HISTORY: History reviewed. No pertinent past surgical history.  ALLERGIES:  is allergic to penicillins.  MEDICATIONS:  Current Facility-Administered Medications  Medication Dose Route Frequency Provider Last Rate Last Dose  . 0.9 % NaCl with KCl 40 mEq / L  infusion   Intravenous Continuous Sital Mody, MD 100 mL/hr at 12/24/14 1141 100 mL/hr at 12/24/14 1141  . acetaminophen (TYLENOL) tablet 650 mg  650 mg Oral Q6H PRN Marguarite ArbourJeffrey D Sparks, MD       Or  . acetaminophen (TYLENOL) suppository 650 mg  650 mg Rectal Q6H PRN Marguarite ArbourJeffrey D Sparks, MD      . albuterol (PROVENTIL) (2.5 MG/3ML) 0.083% nebulizer solution 2.5 mg  2.5 mg Nebulization Q6H Marguarite ArbourJeffrey D Sparks, MD    2.5 mg at 12/24/14 1341  . aspirin chewable tablet 81 mg  81 mg Oral Daily Sital Mody, MD   81 mg at 12/23/14 1434  . atorvastatin (LIPITOR) tablet 80 mg  80 mg Oral q1800 Erin FullingKurian Kasa, MD   80 mg at 12/23/14 1812  . cetirizine HCl (Zyrtec) 5 MG/5ML syrup 5 mg  5 mg Oral BID Mellody DrownMatthew Smith, MD      . docusate sodium (COLACE) capsule 100 mg  100 mg Oral BID Marguarite ArbourJeffrey D Sparks, MD   100 mg at 12/21/2014 2250  . glycopyrrolate (ROBINUL) injection 0.2 mg  0.2 mg Intravenous Q4H Mellody DrownMatthew Smith, MD      . haloperidol lactate (HALDOL) injection 2.5 mg  2.5 mg Intravenous Q6H PRN Oralia Manisavid Willis, MD   2.5 mg at 12/23/14 2247  . heparin injection 5,000 Units  5,000 Units Subcutaneous 3 times per day Marguarite ArbourJeffrey D Sparks, MD   5,000 Units at 12/24/14 1349  . hydrALAZINE (APRESOLINE) injection 10 mg  10 mg Intravenous Q6H PRN Adrian SaranSital Mody, MD      . levETIRAcetam (KEPPRA) 500 mg in sodium chloride 0.9 % 100 mL IVPB  500 mg Intravenous Q12H Marguarite ArbourJeffrey D Sparks, MD 420 mL/hr at 12/24/14 0611 500 mg at 12/24/14 0611  . LORazepam (ATIVAN) injection 2 mg  2 mg Intravenous Q4H PRN Marguarite ArbourJeffrey D Sparks, MD   2 mg  at 12/23/14 2037  . metoprolol (LOPRESSOR) injection 5 mg  5 mg Intravenous 4 times per day Marguarite Arbour, MD   5 mg at 12/24/14 1220  . ondansetron (ZOFRAN) tablet 4 mg  4 mg Oral Q6H PRN Marguarite Arbour, MD       Or  . ondansetron [facility]) injection 4 mg  4 mg Intravenous Q6H PRN Marguarite Arbour, MD      . pantoprazole (PROTONIX) injection 40 mg  40 mg Intravenous Q12H Adrian Saran, MD   40 mg at 12/24/14 1002  . sodium chloride 0.9 % 1,000 mL with thiamine 100 mg, folic acid 1 mg, multivitamins adult 10 mL infusion   Intravenous Continuous Adrian Saran, MD 40 mL/hr at 12/24/14 1211    . sodium chloride 0.9 % injection 3 mL  3 mL Intravenous Q12H Marguarite Arbour, MD   3 mL at 12/24/14 1002    Vital Signs: BP 163/34 mmHg  Pulse 97  Temp(Src) 99.9 F (37.7 C) (Oral)  Resp 19  Ht  (1.676 m)  Wt 122.5 kg (270 lb 1  oz)  BMI 43.61 kg/m2  SpO2 95% Filed Weights   Dec 23, 2014 1925 12/23/14 0610 12/24/14 0500  Weight: 125.8 kg (277 lb 5.4 oz) 123.8 kg (272 lb 14.9 oz) 122.5 kg (270 lb 1 oz)    Estimated body mass index is 43.61 kg/(m^2) as calculated from the following:   Height as of this encounter:  (1.676 m).   Weight as of this encounter: 122.5 kg (270 lb 1 oz).  PERFORMANCE STATUS (ECOG) : 1 - Symptomatic but completely ambulatory  PHYSICAL EXAM: Generall: ill appearing, obese HEENT: secretions Neck: Trachea midline  Cardiovascular: reg rate and rhythm,  Pulmonary/Chest: rhonchi not bilateral ant fields Abdominal: Soft. Hypoactive bowel sounds GU: no suprapubic tenderness Extremities: - edema BLE's Neurological: moves extremities, minimal deficit to L side currently 4/5 strength to LUE and LLE Skin: Warm, dry and intact.  Psychiatric: Unable to assess   LABS: CBC:    Component Value Date/Time   WBC 14.3* 12/23/2014 0408   HGB 10.9* 12/23/2014 0408   HCT 34.0* 12/23/2014 0408   PLT 267 12/23/2014 0408   MCV 80.0 12/23/2014 0408   NEUTROABS 23.5* 12/23/2014 1444   LYMPHSABS 1.9 12-23-2014 1444   MONOABS 2.2* 12/23/14 1444   EOSABS 0.0 12-23-2014 1444   BASOSABS 0.0 23-Dec-2014 1444   Comprehensive Metabolic Panel:    Component Value Date/Time   NA 139 12/24/2014 0710   K 4.2 12/24/2014 0710   CL 104 12/24/2014 0710   CO2 28 12/24/2014 0710   BUN <5* 12/24/2014 0710   CREATININE 1.09 12/24/2014 0710   GLUCOSE 127* 12/24/2014 0710   CALCIUM 8.0* 12/24/2014 0710   AST 107* 12/23/2014 0408   ALT 29 12/23/2014 0408   ALKPHOS 67 12/23/2014 0408   BILITOT 1.4* 12/23/2014 0408   PROT 6.5 12/23/2014 0408   ALBUMIN 3.6 12/23/2014 0408    IMPRESSION: Palliative Care consult requested for this 69 y.o. male for goals of medical therapy in patient with HTN, HLD, T2DM, gout, arthritis and current ETOH abuse.  Pt presented to ER with altered mental status and L facial droop.  Pt  had seizure down in ER.  ER workup significant for leukocytosis and MRI showed R pontine stroke.  Pt currently in CCU.  Family not at bedside.  Pt with improving leukocytosis today.  Pt with normal ABG.  Pt now on robinul for secretions.  Pt  is currently confused and not able to make decisions for himself.  Pt does not have a HCPOA.  Spoke with sister, Lucas Ortega, who takes care of pt.  Family meeting set up with siblings and daughter at [id number] tomorrow morning for goals of care.    PLAN: 1. Full code 2. Cont current care     More than 50% of the visit was spent in counseling/coordination of care: Yes  Time Spent: 75 minutes

## 2014-12-24 NOTE — Progress Notes (Signed)
Pt has intermittent agitation and restlessness, trying to get out of bed.  Becomes hypertensive and tachypneic.  Per previous nurse, prn haldol makes pt extremely lethargic.  Notified Dr.  Juliene PinaMody of pt agitation and haldol effects, per Dr. Juliene PinaMody may start precedex drip if needed for agitation.

## 2014-12-25 ENCOUNTER — Inpatient Hospital Stay: Payer: Medicare Other

## 2014-12-25 DIAGNOSIS — T17908A Unspecified foreign body in respiratory tract, part unspecified causing other injury, initial encounter: Secondary | ICD-10-CM | POA: Diagnosis present

## 2014-12-25 LAB — GLUCOSE, CAPILLARY: GLUCOSE-CAPILLARY: 112 mg/dL — AB (ref 65–99)

## 2014-12-25 LAB — MAGNESIUM: Magnesium: 1.7 mg/dL (ref 1.7–2.4)

## 2014-12-25 LAB — BASIC METABOLIC PANEL
ANION GAP: 8 (ref 5–15)
BUN: 8 mg/dL (ref 6–20)
CHLORIDE: 107 mmol/L (ref 101–111)
CO2: 26 mmol/L (ref 22–32)
Calcium: 8.4 mg/dL — ABNORMAL LOW (ref 8.9–10.3)
Creatinine, Ser: 1.17 mg/dL (ref 0.61–1.24)
GFR calc Af Amer: >60 mL/min (ref >60)
GFR calc non Af Amer: >60 mL/min (ref >60)
Glucose, Bld: 95 mg/dL (ref 65–99)
POTASSIUM: 4.7 mmol/L (ref 3.5–5.1)
SODIUM: 141 mmol/L (ref 135–145)

## 2014-12-25 LAB — PHOSPHORUS: Phosphorus: 3.1 mg/dL (ref 2.5–4.6)

## 2014-12-25 MED ORDER — STERILE WATER FOR INJECTION IJ SOLN
INTRAMUSCULAR | Status: AC
  Filled 2014-12-25: qty 10

## 2014-12-25 MED ORDER — VECURONIUM BROMIDE 10 MG IV SOLR
INTRAVENOUS | Status: AC
  Administered 2014-12-25: 10 mg
  Filled 2014-12-25: qty 10

## 2014-12-25 MED ORDER — MAGNESIUM SULFATE 2 GM/50ML IV SOLN
2.0000 g | Freq: Once | INTRAVENOUS | Status: AC
  Administered 2014-12-25: 2 g via INTRAVENOUS
  Filled 2014-12-25: qty 50

## 2014-12-25 MED ORDER — FENTANYL CITRATE (PF) 100 MCG/2ML IJ SOLN
INTRAMUSCULAR | Status: AC
  Administered 2014-12-25: 100 ug
  Filled 2014-12-25: qty 2

## 2014-12-25 MED ORDER — CETIRIZINE HCL 5 MG/5ML PO SYRP
5.0000 mg | ORAL_SOLUTION | Freq: Two times a day (BID) | ORAL | Status: DC
  Administered 2014-12-25 – 2014-12-27 (×4): 5 mg via ORAL
  Filled 2014-12-25 (×7): qty 5

## 2014-12-25 MED ORDER — MIDAZOLAM HCL 2 MG/2ML IJ SOLN
INTRAMUSCULAR | Status: AC
  Administered 2014-12-25: 4 mg
  Filled 2014-12-25: qty 4

## 2014-12-25 NOTE — Progress Notes (Signed)
NEUROLOGY NOTE  S: Pt intubated today for airway protection  ROS unobtainable secondary to intubation  O: AFVSS NAD, overweight Normocephalic, oropharynx with thick secretions supple, no lymphadenoopathy Coarse BS B, no wheezing tachycardic, no murmurs  Intubated, sedated, does not open or follow, GCS 3T PERRLA, + corneals B, good cough No movement to pain  Labs reviewed by me   Impression: 1. Pontine infarct- Stable exam except for swallowing issues but limited today 2. Dysphagia- unable to assess now - HOB 60 degrees - continue Robinul to .2mg  q4h - continue Zyrtec 5mg  BID - Continue ASA and statin - needs tube feeds -  Will likely need PEG,  - Would still watch in ICU but will follow

## 2014-12-25 NOTE — Procedures (Signed)
Endotracheal Intubation: Patient required placement of an artificial airway secondary to resp failure.   Consent: Emergent.   Hand washing performed prior to starting the procedure.   Medications administered for sedation prior to procedure: Midazolam 0.2-0.3mg /kg IVP; Vecuronium 0.1-0.2 mg/kg IVP; Fentanyl 13mcg/kg IVP.   Procedure: A time out procedure was called and correct patient, name, & ID confirmed. Needed supplies and equipment were assembled and checked to include ETT, 10 ml syringe, Glidescope, Mac and Miller blades, suction, oxygen and bag mask valve, end tidal CO2 monitor. Patient was positioned to align the mouth and pharynx to facilitate visualization of the glottis.  Heart rate, SpO2 and blood pressure was continuously monitored during the procedure. Pre-oxygenation was conducted prior to intubation and endotracheal tube was placed through the vocal cords into the trachea.  During intubation an assistant applied gentle pressure to the cricoid cartilage.   The artificial airway was placed under direct visualization via glidescope route using a 8.5 ETT on the first attempt.    ETT was secured at 23 cm mark.    Placement was confirmed by auscuitation of lungs with good breath sounds bilaterally and no stomach sounds.  Condensation was noted on endotracheal tube.  Pulse ox %.  CO2 detector in place with appropriate color change.   Complications: None .   Operator: Jillene Wehrenberg.   Chest radiograph ordered and pending.   Comments: OGT placed via glidescope.  Lucie LeatherKurian David Doryce Mcgregory, M.D. Pulmonary & Critical Care Medicine [facility] Medical Director Intensive Care Unit

## 2014-12-25 NOTE — Consult Note (Signed)
Family meeting held with daughter, siblings and extended family. Pt's acute and chronic problems discussed and pending ST eval.  Family states pt would want short term intubation and an attempt at resuscitation.  Family is unsure about PEG is pt fails swallowing eval.  Will discuss after this is complete.  Pt's daughter states she does not accept decision making for pt and family has appointed pt's sister Lucas Ortega as Management consultantdecision maker.  Time spent: 40 minutes

## 2014-12-25 NOTE — Consult Note (Signed)
PULMONARY / CRITICAL CARE MEDICINE   Name: Lucas Ortega MRN: [id number] DOB: 23-May-1946    ADMISSION DATE:  01/01/2015   CHIEF COMPLAINT:   Acute CVA with inability to clear secretions   HISTORY OF PRESENT ILLNESS   69 yo AAM admitted to ICU for acute CVA with inability to clear secretions, patient is noted to be heavy ETOH abuser, now on Precedex infusion for Dt's  Patient lethargic, but arousable, high risk for intubation and cardiac arrest.       PAST MEDICAL HISTORY    :  Past Medical History  Diagnosis Date  . Hypertension   . Hyperlipidemia   . Arthritis   . Gout    History reviewed. No pertinent past surgical history. Prior to Admission medications   Medication Sig Start Date End Date Taking? Authorizing Provider  amLODipine (NORVASC) 5 MG tablet Take 5 mg by mouth daily.   Yes Historical Provider, MD  colchicine 0.6 MG tablet Take 0.6 mg by mouth daily.   Yes Historical Provider, MD  lisinopril (PRINIVIL,ZESTRIL) 40 MG tablet Take 40 mg by mouth daily.   Yes Historical Provider, MD  Multiple Vitamins-Minerals (CENTRUM SILVER ADULT 50+) TABS Take 1 tablet by mouth daily.   Yes Historical Provider, MD  vitamin B-12 (CYANOCOBALAMIN) 1000 MCG tablet Take 1,000 mcg by mouth daily.   Yes Historical Provider, MD   Allergies  Allergen Reactions  . Penicillins Other (See Comments)    Unknown reaction     FAMILY HISTORY   Family History  Problem Relation Age of Onset  . Family history unknown: Yes      SOCIAL HISTORY    reports that he has never smoked. He does not have any smokeless tobacco history on file. He reports that he drinks alcohol. He reports that he does not use illicit drugs.  Review of Systems  Unable to perform ROS: critical illness      VITAL SIGNS    Temp:  [99.7 F (37.6 C)-100.8 F (38.2 C)] 100.8 F (38.2 C) (05/18 0800) Pulse Rate:  [84-117] 84 (05/18 0700) Resp:  [16-21] 17 (05/18 0700) BP: (130-169)/(34-99) 131/69 mmHg  (05/18 0700) SpO2:  [85 %-100 %] 97 % (05/18 0855) HEMODYNAMICS:   VENTILATOR SETTINGS:   INTAKE / OUTPUT:  Intake/Output Summary (Last 24 hours) at 12/25/14 1046 Last data filed at 12/25/14 0600  Gross per 24 hour  Intake 2227.98 ml  Output   1650 ml  Net 577.98 ml       PHYSICAL EXAM   Physical Exam  Constitutional: He appears distressed.  Increased oral secretions  HENT:  Head: Normocephalic and atraumatic.  Eyes: Pupils are equal, round, and reactive to light. No scleral icterus.  Neck: Normal range of motion. Neck supple.  Cardiovascular: Normal rate and regular rhythm.   No murmur heard. Pulmonary/Chest: He is in respiratory distress. He has no wheezes. He has rales.  resp distress  Abdominal: Soft. He exhibits no distension. There is no tenderness.  Musculoskeletal: He exhibits no edema.  Neurological: He displays normal reflexes. Coordination normal.  lethragic   Skin: Skin is warm. No rash noted. He is not diaphoretic.       LABS   LABS:  CBC  Recent Labs Lab 12/08/2014 1444 12/23/14 0408  WBC 27.6* 14.3*  HGB 11.8* 10.9*  HCT 37.8* 34.0*  PLT 283 267   Coag's  Recent Labs Lab 12/23/14 0408  INR 1.09   BMET  Recent Labs Lab 12/23/14 0408  12/23/14 2216  12/24/14 0710 12/25/14 0448  NA 135  --   --  139 141  K 2.6*  < > 3.6 4.2 4.7  CL 94*  --   --  104 107  CO2 31  --   --  28 26  BUN <5*  --   --  <5* 8  CREATININE 1.09  --   --  1.09 1.17  GLUCOSE 101*  --   --  127* 95  < > = values in this interval not displayed. Electrolytes  Recent Labs Lab 12/23/14 0408 12/23/14 1403 12/24/14 0710 12/25/14 0448  CALCIUM 8.1*  --  8.0* 8.4*  MG 1.8 2.1 2.0 1.7  PHOS 2.9  --  2.6 3.1   Sepsis Markers  Recent Labs Lab 01/02/2015 1444 01/01/2015 2008 12/30/2014 2344  LATICACIDVEN 16.4* 5.1* 1.9   ABG  Recent Labs Lab 12/24/14 1140  PHART 7.44  PCO2ART 41  PO2ART 75*   Liver Enzymes  Recent Labs Lab 12/28/2014 1444  12/23/14 0408  AST 128* 107*  ALT 32 29  ALKPHOS 80 67  BILITOT 1.7* 1.4*  ALBUMIN 4.0 3.6   Cardiac Enzymes  Recent Labs Lab 12/15/2014 1444  TROPONINI 0.04*   Glucose  Recent Labs Lab 12/11/2014 1944 12/23/14 0750 12/24/14 0727  GLUCAP 125* 123* 119*     Recent Results (from the past 240 hour(s))  Blood culture (routine x 2)     Status: None (Preliminary result)   Collection Time: 01/05/2015  2:44 PM  Result Value Ref Range Status   Specimen Description BLOOD  Final   Special Requests BLOOD  Final   Culture NO GROWTH 3 DAYS  Final   Report Status PENDING  Incomplete  Blood culture (routine x 2)     Status: None (Preliminary result)   Collection Time: 12/27/2014  3:02 PM  Result Value Ref Range Status   Specimen Description BLOOD  Final   Special Requests BLOOD  Final   Culture NO GROWTH 3 DAYS  Final   Report Status PENDING  Incomplete      IMAGING    Dg Chest 1 View  12/24/2014   CLINICAL DATA:  Aspiration into airway.  EXAM: CHEST  1 VIEW  COMPARISON:  12/23/2014.  FINDINGS: Poor inspiration. Grossly stable enlarged cardiac silhouette. Mild patchy opacity at both lung bases.  IMPRESSION: 1. Poor inspiration with mild patchy atelectasis at both lung bases. Aspiration pneumonitis is less likely. 2. Grossly stable cardiomegaly.   Electronically Signed   By: Beckie SaltsSteven  Reid M.D.   On: 12/24/2014 13:48        ASSESSMENT/PLAN   69 yo AAm admitted to ICU for acute CVA with inability to clear secretions, high risk for aspiration, with acute DT's on precdex infusion   PULMONARY-high risk for intubation -oxygen as needed -glycopurulate for secretions   CARDIOVASCULAR -needs ICU monitoring  RENAL -watch UO  GASTROINTESTINAL -Needs PEG tube for survival -NPO for now -folow up speech therapy  HEMATOLOGIC -follow h/h  INFECTIOUS -no abx at this time  ENDOCRINE Follow FSBS  NEUROLOGIC encephalopathy from Dt's and acute CVA -high risk for  intubation -precedex and CIWA protocol -bannana bag infusion  Palliative care team consulted    I have personally obtained a history, examined the patient, evaluated laboratory and imaging results, formulated the assessment and plan and placed orders.  The Patient requires high complexity decision making for assessment and support, frequent evaluation and titration of therapies, application of advanced monitoring technologies and extensive interpretation  of multiple databases. Critical Care Time devoted to patient care services described in this note is 50 minutes.   Overall, patient is critically ill, prognosis is guarded. Patient at high risk for cardiac arrest and death.   Lucie LeatherKurian David Ziya Coonrod, M.D. Pulmonary & Critical Care Medicine [facility] Medical Director Intensive Care Unit   12/25/2014, 10:46 AM

## 2014-12-25 NOTE — Therapy (Signed)
Patient congested with wheezes and rales during and post neb tx. Patient encouraged to cough with little effect. NTS for large amount of thin yellow, purulent secretions.

## 2014-12-25 NOTE — Clinical Documentation Improvement (Signed)
Supporting Information: Lactic acid: 5/15:  1.9 5/15:  5.1 5/15: 16.4  Possible Clinical Condition: . Bacteremia (positive blood cultures only) . Sepsis with UTI, versus UTI only . Sepsis-specify causative organism if known . Sepsis due to: --Device --Implant --Graft --Infusion . Severe sepsis-sepsis with organ dysfunction --Specify organ dysfunction Respiratory failure Encephalopathy Acute kidney failure Other (specify) . SIRS (Systemic Inflammatory Response Syndrome --With or without organ dysfunction . Document septic shock if present . Document any associated diagnoses/conditions    Thank Gabriel CirriYou,  Allina Riches Mathews-Bethea,RN,BSN,Clinical Documentation Specialist [phone] [email or web address]

## 2014-12-25 NOTE — Progress Notes (Signed)
Received call from radiologist stating ETT tube needs to be repositioned. Called Dr. Belia HemanKasa and read the results to him, he stated he would come and reassess the ETT.

## 2014-12-25 NOTE — Procedures (Addendum)
Central Venous Catheter Placement: Indication: Patient receiving vesicant or irritant drug.; Patient receiving intravenous therapy for longer than 5 days.; Patient has limited or no vascular access.   Consent:written  Risks and benefits explained in detail including risk of infection, bleeding, respiratory failure and death..   Hand washing performed prior to starting the procedure.   Procedure: An active timeout was performed and correct patient, name, & ID confirmed.  After explaining risk and benefits, patient was positioned correctly for central venous access. Patient was prepped using strict sterile technique including chlorohexadine preps, sterile drape, sterile gown and sterile gloves.  The area was prepped, draped and anesthetized in the usual sterile manner. Patient comfort was obtained.  A triple lumen catheter was placed in LEFT Internal Jugularr Vein There was good blood return, catheter caps were placed on lumens, catheter flushed easily, the line was secured and a sterile dressing applied.   Ultrasound Sonosite was used to visualize vasculature and guidance of needle.   Number of Attempts: 1 Complications:none Estimated Blood Loss:1 cc  Chest Radiograph indicated and ordered.   Operator: Markeshia Giebel.   Lucie LeatherKurian David Kaytlin Burklow, M.D. Pulmonary & Critical Care Medicine [facility] Medical Director Intensive Care Unit

## 2014-12-25 NOTE — Consult Note (Signed)
Palliative Medicine Inpatient Consult Follow Up Note   Name: Lucas Ortega Date: 12/25/2014 MRN: [id number]  DOB: 1946/06/01  Referring Physician: Altamese DillingVaibhavkumar Vachhani, MD  Palliative Care consult requested for this 69 y.o. male for goals of medical therapy in patient with with HTN, HLD, T2DM, gout, arthritis and current ETOH abuse  Pt resting in bed on Precedex currently.  Pt with secretions, but appears in NAD.  Family not at bedside.    REVIEW OF SYSTEMS:  Patient is not able to provide ROS  CODE STATUS: Full code   PAST MEDICAL HISTORY: Past Medical History  Diagnosis Date  . Hypertension   . Hyperlipidemia   . Arthritis   . Gout     PAST SURGICAL HISTORY: History reviewed. No pertinent past surgical history.  Vital Signs: BP 131/69 mmHg  Pulse 84  Temp(Src) 100.8 F (38.2 C) (Oral)  Resp 17  Ht 5\' 6"  (1.676 m)  Wt 122.5 kg (270 lb 1 oz)  BMI 43.61 kg/m2  SpO2 97% Filed Weights   04/13/2015 1925 12/23/14 0610 12/24/14 0500  Weight: 125.8 kg (277 lb 5.4 oz) 123.8 kg (272 lb 14.9 oz) 122.5 kg (270 lb 1 oz)    Estimated body mass index is 43.61 kg/(m^2) as calculated from the following:   Height as of this encounter: 5\' 6"  (1.676 m).   Weight as of this encounter: 122.5 kg (270 lb 1 oz).  PHYSICAL EXAM: Generall: ill appearing, obese HEENT: secretions Neck: Trachea midline  Cardiovascular: reg rate and rhythm,  Pulmonary/Chest: rhonchi  bilateral ant fields Abdominal: Soft. Hypoactive bowel sounds GU: no suprapubic tenderness Extremities: - edema BLE's Neurological: moves extremities, minimal deficit to L side currently 4/5 strength to LUE and LLE Skin: Warm, dry and intact.  Psychiatric: Unable to assess  LABS: CBC:    Component Value Date/Time   WBC 14.3* 12/23/2014 0408   HGB 10.9* 12/23/2014 0408   HCT 34.0* 12/23/2014 0408   PLT 267 12/23/2014 0408   MCV 80.0 12/23/2014 0408   NEUTROABS 23.5* Feb 22, 2015 1444   LYMPHSABS 1.9 Feb 22, 2015 1444   MONOABS 2.2* Feb 22, 2015 1444   EOSABS 0.0 Feb 22, 2015 1444   BASOSABS 0.0 Feb 22, 2015 1444   Comprehensive Metabolic Panel:    Component Value Date/Time   NA 141 12/25/2014 0448   K 4.7 12/25/2014 0448   CL 107 12/25/2014 0448   CO2 26 12/25/2014 0448   BUN 8 12/25/2014 0448   CREATININE 1.17 12/25/2014 0448   GLUCOSE 95 12/25/2014 0448   CALCIUM 8.4* 12/25/2014 0448   AST 107* 12/23/2014 0408   ALT 29 12/23/2014 0408   ALKPHOS 67 12/23/2014 0408   BILITOT 1.4* 12/23/2014 0408   PROT 6.5 12/23/2014 0408   ALBUMIN 3.6 12/23/2014 0408    IMPRESSION: [name] is a 69 y.o. Man with HTN, HLD, T2DM, gout, arthritis and current ETOH abuse. Pt presented to ER with altered mental status and L facial droop. Pt had seizure down in ER. ER workup significant for leukocytosis and MRI showed R pontine stroke. Pt currently in CCU. Family not at bedside.   Pt resting in bed comfortably and on Precedex.  Pt with continued secretions, but improved with robinul. ST eval pending today.  Family meeting at 1030 for goals of care.  PLAN: 1. Full code 2. Goals of care meeting 1030     More than 50% of the visit was spent in counseling/coordination of care: YES  Time Spent: 25 minutes

## 2014-12-25 NOTE — Progress Notes (Signed)
Error: Charted on wrong patient

## 2014-12-26 LAB — BASIC METABOLIC PANEL
Anion gap: 9 (ref 5–15)
BUN: 10 mg/dL (ref 6–20)
CHLORIDE: 102 mmol/L (ref 101–111)
CO2: 24 mmol/L (ref 22–32)
Calcium: 8.3 mg/dL — ABNORMAL LOW (ref 8.9–10.3)
Creatinine, Ser: 1.27 mg/dL — ABNORMAL HIGH (ref 0.61–1.24)
GFR calc Af Amer: >60 mL/min (ref >60)
GFR calc non Af Amer: 56 mL/min — ABNORMAL LOW (ref >60)
Glucose, Bld: 116 mg/dL — ABNORMAL HIGH (ref 65–99)
Potassium: 4.7 mmol/L (ref 3.5–5.1)
SODIUM: 135 mmol/L (ref 135–145)

## 2014-12-26 LAB — GLUCOSE, CAPILLARY
GLUCOSE-CAPILLARY: 129 mg/dL — AB (ref 65–99)
Glucose-Capillary: 100 mg/dL — ABNORMAL HIGH (ref 65–99)

## 2014-12-26 LAB — MAGNESIUM: Magnesium: 1.5 mg/dL — ABNORMAL LOW (ref 1.7–2.4)

## 2014-12-26 LAB — PHOSPHORUS: PHOSPHORUS: 2.4 mg/dL — AB (ref 2.5–4.6)

## 2014-12-26 MED ORDER — MAGNESIUM SULFATE 4 GM/100ML IV SOLN
4.0000 g | Freq: Once | INTRAVENOUS | Status: AC
Start: 2014-12-26 — End: 2014-12-26
  Administered 2014-12-26: 4 g via INTRAVENOUS
  Filled 2014-12-26: qty 100

## 2014-12-26 MED ORDER — CHLORHEXIDINE GLUCONATE 0.12 % MT SOLN
15.0000 mL | Freq: Two times a day (BID) | OROMUCOSAL | Status: DC
  Administered 2014-12-26 – 2015-01-02 (×17): 15 mL via OROMUCOSAL

## 2014-12-26 MED ORDER — MIDAZOLAM HCL 2 MG/2ML IJ SOLN
2.0000 mg | INTRAMUSCULAR | Status: DC | PRN
Start: 2014-12-26 — End: 2015-01-03
  Administered 2014-12-26 – 2015-01-01 (×3): 2 mg via INTRAVENOUS
  Filled 2014-12-26 (×3): qty 2

## 2014-12-26 MED ORDER — FREE WATER
25.0000 mL | Status: DC
  Administered 2014-12-26 – 2015-01-03 (×43): 25 mL

## 2014-12-26 MED ORDER — VITAL HIGH PROTEIN PO LIQD
1000.0000 mL | ORAL | Status: DC
  Administered 2014-12-26 – 2014-12-27 (×2): 1000 mL

## 2014-12-26 MED ORDER — PROPOFOL 1000 MG/100ML IV EMUL
5.0000 ug/kg/min | INTRAVENOUS | Status: DC
  Administered 2014-12-26 (×2): 20 ug/kg/min via INTRAVENOUS
  Administered 2014-12-26: 50 ug/kg/min via INTRAVENOUS
  Administered 2014-12-27: 8 ug/kg/min via INTRAVENOUS
  Administered 2014-12-27: 20.816 ug/kg/min via INTRAVENOUS
  Administered 2014-12-27: 8 ug/kg/min via INTRAVENOUS
  Administered 2014-12-28 (×2): 10 ug/kg/min via INTRAVENOUS
  Administered 2014-12-28: 15 ug/kg/min via INTRAVENOUS
  Administered 2014-12-29 (×2): 20 ug/kg/min via INTRAVENOUS
  Administered 2014-12-29: 15 ug/kg/min via INTRAVENOUS
  Administered 2014-12-30: 20 ug/kg/min via INTRAVENOUS
  Administered 2014-12-30: 40 ug/kg/min via INTRAVENOUS
  Administered 2014-12-30: 20 ug/kg/min via INTRAVENOUS
  Administered 2014-12-31 (×2): 50 ug/kg/min via INTRAVENOUS
  Administered 2014-12-31: 52 ug/kg/min via INTRAVENOUS
  Filled 2014-12-26 (×15): qty 100

## 2014-12-26 MED ORDER — SENNOSIDES 8.8 MG/5ML PO SYRP
10.0000 mL | ORAL_SOLUTION | Freq: Two times a day (BID) | ORAL | Status: DC
  Administered 2014-12-26: 2 mL via ORAL
  Administered 2014-12-26 – 2014-12-28 (×4): 10 mL via ORAL
  Filled 2014-12-26 (×5): qty 10

## 2014-12-26 MED ORDER — PROPOFOL 1000 MG/100ML IV EMUL
INTRAVENOUS | Status: AC
  Administered 2014-12-26: 50 ug/kg/min via INTRAVENOUS
  Filled 2014-12-26: qty 100

## 2014-12-26 MED ORDER — FENTANYL 2500MCG IN NS 250ML (10MCG/ML) PREMIX INFUSION
10.0000 ug/h | INTRAVENOUS | Status: DC
Start: 2014-12-26 — End: 2015-01-03
  Administered 2014-12-26: 10 ug/h via INTRAVENOUS
  Administered 2014-12-26: 40 ug/h via INTRAVENOUS
  Administered 2014-12-26: 400 ug/h via INTRAVENOUS
  Administered 2014-12-26 – 2014-12-27 (×3): 10 ug/h via INTRAVENOUS
  Administered 2014-12-27: 300 ug/h via INTRAVENOUS
  Administered 2014-12-28: 10 ug/h via INTRAVENOUS
  Administered 2014-12-28: 250 ug/h via INTRAVENOUS
  Administered 2014-12-29: 10 ug/h via INTRAVENOUS
  Administered 2014-12-29 (×2): 300 ug/h via INTRAVENOUS
  Administered 2014-12-30: 10 ug/h via INTRAVENOUS
  Administered 2014-12-30: 300 ug/h via INTRAVENOUS
  Administered 2014-12-31: 100 ug/h via INTRAVENOUS
  Administered 2014-12-31: 50 ug/h via INTRAVENOUS
  Administered 2015-01-01 – 2015-01-02 (×2): 10 ug/h via INTRAVENOUS
  Administered 2015-01-02: 250 ug/h via INTRAVENOUS
  Administered 2015-01-03: 300 ug/h via INTRAVENOUS
  Administered 2015-01-03: 10 ug/h via INTRAVENOUS
  Filled 2014-12-26 (×18): qty 250

## 2014-12-26 MED ORDER — POTASSIUM & SODIUM PHOSPHATES 280-160-250 MG PO PACK
1.0000 | PACK | Freq: Three times a day (TID) | ORAL | Status: DC
  Administered 2014-12-26 (×3): 1 via ORAL
  Filled 2014-12-26 (×3): qty 1

## 2014-12-26 MED ORDER — FAMOTIDINE IN NACL 20-0.9 MG/50ML-% IV SOLN
20.0000 mg | Freq: Two times a day (BID) | INTRAVENOUS | Status: DC
  Administered 2014-12-26 – 2014-12-27 (×2): 20 mg via INTRAVENOUS
  Filled 2014-12-26 (×5): qty 50

## 2014-12-26 NOTE — Progress Notes (Signed)
NEUROLOGY NOTE  S: still intubated, no clinical changes  ROS unobtainable secondary to intubation  O: AFVSS NAD, overweight Normocephalic, oropharynx with thick secretions supple, no lymphadenoopathy Coarse BS B, no wheezing tachycardic, no murmurs  Intubated, sedated, does not open or follow, GCS 3T PERRLA, + corneals B, good cough No movement to pain  Labs reviewed by me   Impression: 1. Pontine infarct- Stable exam except for swallowing issues but limited today 2. Dysphagia- unable to assess now -  Recommend weaning sedation soon - HOB 60 degrees - continue Robinul to .2mg  q4h - continue Zyrtec 5mg  BID - Continue ASA and statin - needs tube feeds - Will likely need PEG,  - Would still watch in ICU but will follow

## 2014-12-26 NOTE — Progress Notes (Signed)
[facility] -  at [facility]   PATIENT NAME: Lucas HemLester Pinho    MR#:  [id number]  DATE OF BIRTH:  [birth year]  SUBJECTIVE:  he had thick secretions and respi distress, so intubated for airway protection on 12/25/14  REVIEW OF SYSTEMS:   Intubated and sedated on vent support. Review of Systems  Unable to perform ROS  DRUG ALLERGIES:   Allergies  Allergen Reactions  . Penicillins Other (See Comments)    Unknown reaction    VITALS:  Blood pressure 159/88, pulse 68, temperature 99.4 F (37.4 C), temperature source Oral, resp. rate 23, height 5\' 6"  (1.676 m), weight 117.7 kg (259 lb 7.7 oz), SpO2 100 %.  PHYSICAL EXAMINATION:   Physical Exam  Constitutional: No distress.  HENT:  Head: Normocephalic and atraumatic.  Neck: Normal range of motion. Neck supple. No JVD present.  Cardiovascular: Normal rate, regular rhythm and normal heart sounds.   No murmur heard. Pulmonary/Chest: Effort normal. No respiratory distress (think secretions from nose and mouth). He has no wheezes.  ET tube in place, vent support.  Abdominal: Bowel sounds are normal. He exhibits no distension and no mass. There is no tenderness.  Neurological:  Sedated on vent support.  Skin: Skin is warm. No rash noted. He is not diaphoretic.    LABORATORY PANEL:   CBC  Recent Labs Lab 12/23/14 0408  WBC 14.3*  HGB 10.9*  HCT 34.0*  PLT 267   ------------------------------------------------------------------------------------------------------------------  Chemistries   Recent Labs Lab 12/23/14 0408  12/26/14 0457  NA 135  < > 135  K 2.6*  < > 4.7  CL 94*  < > 102  CO2 31  < > 24  GLUCOSE 101*  < > 116*  BUN <5*  < > 10  CREATININE 1.09  < > 1.27*  CALCIUM 8.1*  < > 8.3*  MG 1.8  < > 1.5*  AST 107*  --   --   ALT 29  --   --   ALKPHOS 67  --   --   BILITOT 1.4*  --   --   < > = values in this interval not  displayed. ------------------------------------------------------------------------------------------------------------------  Cardiac Enzymes  Recent Labs Lab 12/29/2014 1444  TROPONINI 0.04*   ------------------------------------------------------------------------------------------------------------------  RADIOLOGY:  Dg Chest 2 View  12/21/2014   CLINICAL DATA:  Possible aspiration.  Abnormal lung sounds.  EXAM: CHEST  2 VIEW  COMPARISON:  None.  FINDINGS: The heart size and mediastinal contours are within normal limits. Both lungs are clear. No pneumothorax or pleural effusion is noted. The visualized skeletal structures are unremarkable.  IMPRESSION: No active cardiopulmonary disease.   Electronically Signed   By: Lupita RaiderJames  Green Jr, M.D.   On: 12/21/2014 15:02   Ct Head (brain) Wo Contrast  12/14/2014   IMPRESSION: Minimal diffuse cortical atrophy. Mild chronic ischemic white matter disease. No acute intracranial abnormality seen. These results were called by telephone at the time of interpretation on 01/06/2015 at 2:42 pm to Dr. Lenard LancePaduchowski, who verbally acknowledged these results.   Electronically Signed   By: Lupita RaiderJames  Green Jr, M.D.   On: 12/11/2014 14:44   Portable Chest 1 View  12/23/2014   IMPRESSION: Cardiomegaly  Calcified tortuous aorta  No obvious infiltrate, congestive heart failure or pneumothorax.  Evaluation limited by portable technique and patient's habitus.   Electronically Signed   By: Lacy DuverneySteven  Olson M.D.   On: 12/23/2014 07:31     ASSESSMENT AND PLAN:  This is 69 year old male with a history of hypertension and gout he was found by his family with left facial droop and left-sided weakness. He also has been drinking EtOH heavily. He had a seizure in the ER.  1. Stroke:  Patient has been seen and evaluated by neurology. Patient has a right pontine infarct.   has swallowing issues. The etiology is likely small vessel disease from uncontrolled diabetes and hypertension.  Intubated now, continue with aspirin and statin.  2. Seizure: patient had a witnessed seizure in the emergency department. I suspect this is related to alcohol drinking.   Neurology recommends continuing Keppra 500 mg for 3 days then stop.   3. EtOH dependence: Patient is on CIWA protocol. On sedation now.  4.. Electrolyte abnormalities: pharmacy for assistance with electrolyte protocol.  5. Essential hypertension: Patient is on lisinopril and Norvasc at home,   Here on hydralazine when necessary.  6. Acute Respiratory failure :    Due to secretions - intubated for airway protection.   Pulmonary to manage vent support,.   Palliative care had a meeting with family, they would like everything to be done now.   He may need trach and PEG.    Management plans discussed with the patient's sister who is in agreement.  CODE STATUS FULL  TOTAL TIME TAKING CARE OF THIS PATIENT: 40 min critical care.    Altamese DillingVACHHANI, Kalecia Hartney M.D on 12/26/2014 at 1:35 PM  Between 7am to 6pm - Pager - [phone] After 6pm go to [email or web address] - password [email or web address] [facility]  [facility]  Office  [phone]  CC: Primary care physician; Ashley MarinerGONZALEZ, CHRISTINA MARIE, MD

## 2014-12-26 NOTE — Progress Notes (Signed)
PHARMACY - CRITICAL CARE PROGRESS NOTE  Pharmacy Consult for Electrolyte Management.   Indication: ICU Status   Allergies  Allergen Reactions  . Penicillins Other (See Comments)    Unknown reaction    Patient Measurements: Height: 5\' 6"  (167.6 cm) Weight: 259 lb 7.7 oz (117.7 kg) IBW/kg (Calculated) : 63.8   Vital Signs: Temp: 99.4 F (37.4 C) (05/19 0700) Temp Source: Oral (05/19 0700) BP: 159/88 mmHg (05/19 0700) Pulse Rate: 68 (05/19 0700) Intake/Output from previous day: 05/18 0701 - 05/19 0700 In: 4961.8 [I.V.:3461.8] Out: 2375 [Urine:2375] Intake/Output from this shift:   Vent settings for last 24 hours: Vent Mode:  [-] PRVC FiO2 (%):  [30 %-40 %] 30 % Set Rate:  [16 bmp] 16 bmp Vt Set:  [500 mL] 500 mL PEEP:  [5 cmH20] 5 cmH20 Plateau Pressure:  [15 cmH20] 15 cmH20  Labs:  Recent Labs  12/24/14 0710 12/25/14 0448 12/26/14 0457  CREATININE 1.09 1.17 1.27*  MG 2.0 1.7 1.5*  PHOS 2.6 3.1 2.4*   Estimated Creatinine Clearance: 66.3 mL/min (by C-G formula based on Cr of 1.27).   Recent Labs  12/24/14 0727 12/25/14 1053 12/26/14 0716  GLUCAP 119* 112* 129*    Microbiology: Recent Results (from the past 720 hour(s))  Blood culture (routine x 2)     Status: None (Preliminary result)   Collection Time: 11-06-14  2:44 PM  Result Value Ref Range Status   Specimen Description BLOOD  Final   Special Requests BLOOD  Final   Culture NO GROWTH 4 DAYS  Final   Report Status PENDING  Incomplete  Blood culture (routine x 2)     Status: None (Preliminary result)   Collection Time: 11-06-14  3:02 PM  Result Value Ref Range Status   Specimen Description BLOOD  Final   Special Requests BLOOD  Final   Culture NO GROWTH 4 DAYS  Final   Report Status PENDING  Incomplete    Medications:  Scheduled:  . albuterol  2.5 mg Nebulization Q6H  . aspirin  81 mg Oral Daily  . atorvastatin  80 mg Oral q1800  . cetirizine HCl  5 mg Oral BID  . chlorhexidine  15 mL  Mouth/Throat BID  . docusate sodium  100 mg Oral BID  . famotidine (PEPCID) IV  20 mg Intravenous Q12H  . glycopyrrolate  0.2 mg Intravenous Q4H  . heparin  5,000 Units Subcutaneous 3 times per day  . magnesium sulfate 1 - 4 g bolus IVPB  4 g Intravenous Once  . metoprolol  5 mg Intravenous 4 times per day  . potassium & sodium phosphates  1 packet Oral TID  . sennosides  10 mL Oral BID  . sodium chloride  3 mL Intravenous Q12H   Infusions:  . 0.9 % NaCl with KCl 40 mEq / L 100 mL/hr (12/26/14 0723)  . dexmedetomidine 0.9 mcg/kg/hr (12/26/14 0723)  . fentaNYL infusion INTRAVENOUS 40 mcg/hr (12/26/14 1026)  . propofol (DIPRIVAN) infusion Stopped (12/26/14 1026)  . 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  Currently ventilated and sedated with precedex, which is being transitioned to fentanyl drip and propofol.    Plan:  Electolytes: Mg: 1.5, Mg 4gm IV once ordered. Phos: 2.4, Potassium/Sodium phosphate packets ordered TID x6 doses. SCr increasing to 1.27. All other electrolytes WNL. Will recheck labs in AM.   Rounds discussions:  Protonix changed to famotidine due to decreased cost, and associated with decreased risk of aspiration PNA as well as C diff infections. Feeds started 5/19, Docusate currently ordered. Will add senna 10ml Q12hrs for constipation prophylaxis.   Pharmacy will continue to monitor and adjust per consult.     [address]Matt Bloomfieldarwile, VermontPharm D., BCPS 12/26/2014

## 2014-12-26 NOTE — Progress Notes (Signed)
Initial Nutrition Assessment  DOCUMENTATION CODES:     INTERVENTION:  EN: Per MD, Kasa in rounds wanting to start enteral nutrition.  Recommend starting Vital High Protein at 7720mL/hr with a goal rate of 6762mL/hr to provide 1488kcals and 130g protein without diprivan. Will recommend rechecking Mg and P tomorrow am as ordered s/p supplementation prior to titrating up on nutrition support. Will recommend 25mL of free water flushes q4hours at this time; totaling 1400mL of free water with water from TF. Will continue to follow and make recommendations accordingly.  Coordination of Care: per MD, Belia HemanKasa will add constipation protocol s/p initiation of TF secondary to lack of BM since admission.  NUTRITION DIAGNOSIS:  Inadequate oral intake related to inability to eat as evidenced by NPO status.  GOAL:  Provide needs based on ASPEN/SCCM guidelines (EN: Goal for pt to tolerate intiation of TF and titration to goal within 24-48 hours)  MONITOR:   (EN, Electrolyte and Renal Profile, Digestive system, Pulmonary Profile)  REASON FOR ASSESSMENT:  Ventilator, Consult Enteral/tube feeding initiation and management  ASSESSMENT:  Pt admitted with CVA, now sedated on the vent. Pt currently on CIWA protocol.  SLP following had recommended NPO prior to intubation. Per palliative care team, pt family to decide on PEG placement for nutrition. PMHx: Past Medical History  Diagnosis Date  . Hypertension   . Hyperlipidemia   . Arthritis   . Gout     PO Intake: pt has been NPO since admission (day 4). No family present on visit. Per MST pt did not report poor appetite PTA.   Medications: NS with KCl at 11700mL/hr, Precedex, Banana bag MVI, KPhos, MgS, Propofol 0mL given per I and O chart  Labs: Electrolyte and Renal Profile:    Recent Labs Lab 12/24/14 0710 12/25/14 0448 12/26/14 0457  BUN <5* 8 10  CREATININE 1.09 1.17 1.27*  NA 139 141 135  K 4.2 4.7 4.7  MG 2.0 1.7 1.5*  PHOS 2.6 3.1 2.4*    Glucose Profile:  Recent Labs  12/24/14 0727 12/25/14 1053 12/26/14 0716  GLUCAP 119* 112* 129*   Protein Profile:  Recent Labs Lab 27-Dec-2014 1444 12/23/14 0408  ALBUMIN 4.0 3.6   Per MST no weight loss PTA.  Height:  Ht Readings from Last 1 Encounters:  27-Dec-2014 5\' 6"  (1.676 m)    Weight:  Wt Readings from Last 1 Encounters:  12/26/14 259 lb 7.7 oz (117.7 kg)    Ideal Body Weight:     Wt Readings from Last 10 Encounters:  12/26/14 259 lb 7.7 oz (117.7 kg)  12/23/14 270 lb (122.471 kg)    BMI:  Body mass index is 41.9 kg/(m^2).   Unable to complete Nutrition-Focused physical exam at this time.    Estimated Nutritional Needs:  Kcal:  1298-1652kcals, (11-14kcals/kg) using acutal body weight of 118kg  Protein:  129-161g protein (2.0-2.5g/kg) using IBW of 64.5kg  Fluid:  1613-194535mL of fluid (25-4530mL/kg) using IBW of 64.5kg  Skin:  Reviewed, no issues  Diet Order:  Diet NPO time specified  EDUCATION NEEDS:  No education needs identified at this time   Intake/Output Summary (Last 24 hours) at 12/26/14 1240 Last data filed at 12/26/14 0600  Gross per 24 hour  Intake 4496.62 ml  Output   2375 ml  Net 2121.62 ml    Last BM:  Unknown  HIGH Care Level  Lucas Ortega, RD, LDN Pager [phone]

## 2014-12-26 NOTE — Progress Notes (Addendum)
Subjective: Patient nonverbal, on vent. Neuro requested PEG tube. I spoke with palliative care team. Family is leaning towards no intervention with Peg as they do not feel patient wishes to have PEG. Palliative care discussion is scheduled today at noon with family.  Objective: Vital signs in last 24 hours: Temp:  [97.8 F (36.6 C)-102.4 F (39.1 C)] 99.4 F (37.4 C) (05/19 0700) Pulse Rate:  [29-95] 68 (05/19 0700) Resp:  [16-29] 23 (05/19 0700) BP: (120-169)/(65-91) 159/88 mmHg (05/19 0700) SpO2:  [93 %-100 %] 100 % (05/19 0700) FiO2 (%):  [30 %-40 %] 30 % (05/19 0828) Weight:  [117.7 kg (259 lb 7.7 oz)] 117.7 kg (259 lb 7.7 oz) (05/19 0506)   No LMP for male patient. Body mass index is 41.9 kg/(m^2). General:   Obese, sedated, on vent Head:  Normocephalic and atraumatic. Eyes:  Sclera clear, no icterus.   Conjunctiva pink. Mouth:  ET tube intact Neck:  Supple Heart:  Regular rate and rhythm; no murmurs, clicks, rubs, or gallops. Abdomen:   Protuberant. Decreased bowel sounds.  Soft, nontender and nondistended. No masses, hepatosplenomegaly or hernias noted.  No guarding or rebound tenderness.   Msk:  Symmetrical without gross deformities.  Pulses:  Normal pulses noted. Extremities:  With clubbing.  Trace ankle edema bilaterally. No cyanosis Neurologic:  Sedated Skin:  No jaundice or rash   Intake/Output from previous day: 05/18 0701 - 05/19 0700 In: 4961.8 [I.V.:3461.8] Out: 2375 [Urine:2375]  Lab Results: No results for input(s): WBC, HGB, HCT, PLT in the last 72 hours. BMET  Recent Labs  12/24/14 0710 12/25/14 0448 12/26/14 0457  NA 139 141 135  K 4.2 4.7 4.7  CL 104 107 102  CO2 28 26 24   GLUCOSE 127* 95 116*  BUN <5* 8 10  CREATININE 1.09 1.17 1.27*  CALCIUM 8.0* 8.4* 8.3*   Studies/Results: Dg Chest 1 View  12/24/2014   CLINICAL DATA:  Aspiration into airway.  EXAM: CHEST  1 VIEW  COMPARISON:  12/23/2014.  FINDINGS: Poor inspiration. Grossly stable  enlarged cardiac silhouette. Mild patchy opacity at both lung bases.  IMPRESSION: 1. Poor inspiration with mild patchy atelectasis at both lung bases. Aspiration pneumonitis is less likely. 2. Grossly stable cardiomegaly.   Electronically Signed   By: Beckie SaltsSteven  Reid M.D.   On: 12/24/2014 13:48   Dg Abd 1 View  12/25/2014   CLINICAL DATA:  Check nasogastric catheter placement  EXAM: ABDOMEN - 1 VIEW  COMPARISON:  None.  FINDINGS: Nasogastric catheter is noted within the stomach. Scattered large and small bowel gas is noted. No definitive free air is seen. No other focal abnormality is noted.  IMPRESSION: Nasogastric catheter within the stomach.   Electronically Signed   By: Alcide CleverMark  Lukens M.D.   On: 12/25/2014 16:05   Dg Chest Port 1 View  12/25/2014   CLINICAL DATA:  Intubation.  EXAM: PORTABLE CHEST - 1 VIEW  COMPARISON:  12/24/2014.  FINDINGS: Endotracheal tube tip noted just above the orifice of the right mainstem bronchus. Proximal repositioning should be considered. NG tube and left IJ line in good anatomic position. Low lung volumes with bibasilar atelectasis and/or infiltrates. Cardiomegaly with normal pulmonary vascularity. No acute bony abnormality.  IMPRESSION: 1. Endotracheal tube noted with tip just above the orifice of the right mainstem bronchus. Proximal repositioning should be considered. 2. NG tube and left IJ catheter in good anatomic position. 3. Low lung volumes with bibasilar atelectasis and/or infiltrates. 4. Cardiomegaly. Critical Value/emergent results were  called by telephone at the time of interpretation on 12/25/2014 at 4:07 pm to nurse Lorene Dyhristie, who verbally acknowledged the results.   Electronically Signed   By: Maisie Fushomas  Register   On: 12/25/2014 16:10    Assessment/Plan: Will consider PEG placement tomorrow endoscopically by Dr. Servando SnareWohl if family decides that they want to pursue this. Will await discussion with palliative care team today.    LOS: 4 days  Lorenza Ortega, Lucas Mans  12/26/2014,  9:12 AM [facility]  [address] [place], [place] [address] Phone: [phone] Fax : [phone]  Addendum 3:16 PM:   I spoke with Tonie GriffithStephen Mantzouris, NP with Palliative Care.  Family does not want PEG tube at this time.  Please re-consult if requested. Lorenza Ortega, Lucas Ortega

## 2014-12-26 NOTE — Progress Notes (Signed)
   12/26/14 1051  Clinical Encounter Type  Visited With Patient and family together  Visit Type Initial  Referral From Nurse  Consult/Referral To Chaplain  Spiritual Encounters  Spiritual Needs Prayer  Stress Factors  Patient Stress Factors Health changes  Family Stress Factors Health changes  Chaplain was called by the nurse to visit with family. Daughter wanted prayer. Chaplain and daughter prayed and dicussed concerns. Chaplain Cahlil Sattar A. Saraiya Kozma Ext. [phone]

## 2014-12-26 NOTE — Consult Note (Signed)
PULMONARY / CRITICAL CARE MEDICINE   Name: Lucas Ortega MRN: [id number] DOB: 1946-01-26    ADMISSION DATE:  01/07/2015   CHIEF COMPLAINT:   Acute CVA with inability to clear secretions   HISTORY OF PRESENT ILLNESS   Patient was emergently intubated yesterday, remains sedated Critically ill, will change sedatives  On full vent support       PAST MEDICAL HISTORY    :  Past Medical History  Diagnosis Date  . Hypertension   . Hyperlipidemia   . Arthritis   . Gout    History reviewed. No pertinent past surgical history. Prior to Admission medications   Medication Sig Start Date End Date Taking? Authorizing Provider  amLODipine (NORVASC) 5 MG tablet Take 5 mg by mouth daily.   Yes Historical Provider, MD  colchicine 0.6 MG tablet Take 0.6 mg by mouth daily.   Yes Historical Provider, MD  lisinopril (PRINIVIL,ZESTRIL) 40 MG tablet Take 40 mg by mouth daily.   Yes Historical Provider, MD  Multiple Vitamins-Minerals (CENTRUM SILVER ADULT 50+) TABS Take 1 tablet by mouth daily.   Yes Historical Provider, MD  vitamin B-12 (CYANOCOBALAMIN) 1000 MCG tablet Take 1,000 mcg by mouth daily.   Yes Historical Provider, MD   Allergies  Allergen Reactions  . Penicillins Other (See Comments)    Unknown reaction     FAMILY HISTORY   Family History  Problem Relation Age of Onset  . Family history unknown: Yes      SOCIAL HISTORY    reports that he has never smoked. He does not have any smokeless tobacco history on file. He reports that he drinks alcohol. He reports that he does not use illicit drugs.  Review of Systems  Unable to perform ROS: critical illness      VITAL SIGNS    Temp:  [97.8 F (36.6 C)-102.4 F (39.1 C)] 99.4 F (37.4 C) (05/19 0700) Pulse Rate:  [29-95] 68 (05/19 0700) Resp:  [16-29] 23 (05/19 0700) BP: (120-169)/(65-91) 159/88 mmHg (05/19 0700) SpO2:  [93 %-100 %] 100 % (05/19 0700) FiO2 (%):  [30 %-40 %] 30 % (05/19 0828) Weight:  [259 lb  7.7 oz (117.7 kg)] 259 lb 7.7 oz (117.7 kg) (05/19 0506) HEMODYNAMICS:   VENTILATOR SETTINGS: Vent Mode:  [-] PRVC FiO2 (%):  [30 %-40 %] 30 % Set Rate:  [16 bmp] 16 bmp Vt Set:  [500 mL] 500 mL PEEP:  [5 cmH20] 5 cmH20 Plateau Pressure:  [15 cmH20] 15 cmH20 INTAKE / OUTPUT:  Intake/Output Summary (Last 24 hours) at 12/26/14 1053 Last data filed at 12/26/14 0600  Gross per 24 hour  Intake 4504.62 ml  Output   2375 ml  Net 2129.62 ml       PHYSICAL EXAM   Physical Exam  Constitutional: He appears distressed.  Increased oral secretions  HENT:  Head: Normocephalic and atraumatic.  Eyes: Pupils are equal, round, and reactive to light. No scleral icterus.  Neck: Normal range of motion. Neck supple.  Cardiovascular: Normal rate and regular rhythm.   No murmur heard. Pulmonary/Chest: No respiratory distress. He has no wheezes. He has rales.  resp distress  Abdominal: Soft. He exhibits no distension. There is no tenderness.  Musculoskeletal: He exhibits no edema.  Neurological: He displays normal reflexes. Coordination normal.  Gcs<8T   Skin: Skin is warm. No rash noted. He is diaphoretic.       LABS   LABS:  CBC  Recent Labs Lab 01/01/2015 1444 12/23/14 0408  WBC  27.6* 14.3*  HGB 11.8* 10.9*  HCT 37.8* 34.0*  PLT 283 267   Coag's  Recent Labs Lab 12/23/14 0408  INR 1.09   BMET  Recent Labs Lab 12/24/14 0710 12/25/14 0448 12/26/14 0457  NA 139 141 135  K 4.2 4.7 4.7  CL 104 107 102  CO2 28 26 24   BUN <5* 8 10  CREATININE 1.09 1.17 1.27*  GLUCOSE 127* 95 116*   Electrolytes  Recent Labs Lab 12/24/14 0710 12/25/14 0448 12/26/14 0457  CALCIUM 8.0* 8.4* 8.3*  MG 2.0 1.7 1.5*  PHOS 2.6 3.1 2.4*   Sepsis Markers  Recent Labs Lab 09/11/14 1444 09/11/14 2008 09/11/14 2344  LATICACIDVEN 16.4* 5.1* 1.9   ABG  Recent Labs Lab 12/24/14 1140  PHART 7.44  PCO2ART 41  PO2ART 75*   Liver Enzymes  Recent Labs Lab 09/11/14 1444  12/23/14 0408  AST 128* 107*  ALT 32 29  ALKPHOS 80 67  BILITOT 1.7* 1.4*  ALBUMIN 4.0 3.6   Cardiac Enzymes  Recent Labs Lab 09/11/14 1444  TROPONINI 0.04*   Glucose  Recent Labs Lab 09/11/14 1944 12/23/14 0750 12/24/14 0727 12/25/14 1053 12/26/14 0716  GLUCAP 125* 123* 119* 112* 129*     Recent Results (from the past 240 hour(s))  Blood culture (routine x 2)     Status: None (Preliminary result)   Collection Time: 09/11/14  2:44 PM  Result Value Ref Range Status   Specimen Description BLOOD  Final   Special Requests BLOOD  Final   Culture NO GROWTH 3 DAYS  Final   Report Status PENDING  Incomplete  Blood culture (routine x 2)     Status: None (Preliminary result)   Collection Time: 09/11/14  3:02 PM  Result Value Ref Range Status   Specimen Description BLOOD  Final   Special Requests BLOOD  Final   Culture NO GROWTH 3 DAYS  Final   Report Status PENDING  Incomplete      IMAGING    Dg Abd 1 View  12/25/2014   CLINICAL DATA:  Check nasogastric catheter placement  EXAM: ABDOMEN - 1 VIEW  COMPARISON:  None.  FINDINGS: Nasogastric catheter is noted within the stomach. Scattered large and small bowel gas is noted. No definitive free air is seen. No other focal abnormality is noted.  IMPRESSION: Nasogastric catheter within the stomach.   Electronically Signed   By: Alcide CleverMark  Lukens M.D.   On: 12/25/2014 16:05   Dg Chest Port 1 View  12/25/2014   CLINICAL DATA:  Intubation.  EXAM: PORTABLE CHEST - 1 VIEW  COMPARISON:  12/24/2014.  FINDINGS: Endotracheal tube tip noted just above the orifice of the right mainstem bronchus. Proximal repositioning should be considered. NG tube and left IJ line in good anatomic position. Low lung volumes with bibasilar atelectasis and/or infiltrates. Cardiomegaly with normal pulmonary vascularity. No acute bony abnormality.  IMPRESSION: 1. Endotracheal tube noted with tip just above the orifice of the right mainstem bronchus. Proximal  repositioning should be considered. 2. NG tube and left IJ catheter in good anatomic position. 3. Low lung volumes with bibasilar atelectasis and/or infiltrates. 4. Cardiomegaly. Critical Value/emergent results were called by telephone at the time of interpretation on 12/25/2014 at 4:07 pm to nurse Lorene Dyhristie, who verbally acknowledged the results.   Electronically Signed   By: Maisie Fushomas  Register   On: 12/25/2014 16:10        ASSESSMENT/PLAN   69 yo AAm admitted to ICU for acute CVA with  inability to clear secretions intubated for resp failure. Acute DT's.   PULMONARY- -Respiratory Failure -continue Full MV support -continue Bronchodilator Therapy -Wean Fio2 and PEEP as tolerated -will consider bronch   CARDIOVASCULAR -needs ICU monitoring  RENAL -watch UO  GASTROINTESTINAL -Needs PEG tube for survival -start OG tube feeds  HEMATOLOGIC -follow h/h  INFECTIOUS -no abx at this time  ENDOCRINE Follow FSBS  NEUROLOGIC encephalopathy from Dt's and acute CVA - CIWA protocol -bannana bag infusion  Palliative care team consulted, recommend DNR status and wean to extubate as some point    I have personally obtained a history, examined the patient, evaluated laboratory and imaging results, formulated the assessment and plan and placed orders.  The Patient requires high complexity decision making for assessment and support, frequent evaluation and titration of therapies, application of advanced monitoring technologies and extensive interpretation of multiple databases. Critical Care Time devoted to patient care services described in this note is 40 minutes.   Overall, patient is critically ill, prognosis is guarded. Patient at high risk for cardiac arrest and death.   Lucie Leather, M.D. Pulmonary & Critical Care Medicine [facility] Medical Director Intensive Care Unit   12/26/2014, 10:53 AM

## 2014-12-26 NOTE — Progress Notes (Signed)
Speech Therapy Note: reviewed pt's chart; consulted MD re: pt's status this AM. Pt is not appropriate for ST services at this time sec. to his declined medical/respiratory status requiring full vent support currently. Pt has begun TFs via OG. ST will be available for further services once pt is extubated and able to participate in swallowing assessment.

## 2014-12-26 NOTE — Consult Note (Signed)
Family meeting held with sister, Talbert ForestShirley, and extended family.  Updated on pt's current condition and goals of care discussed.  Pt to remain on vent and OGT feeds started.  Family is not wanting PEG tube at this time.  Pt to remain full code currently and family to further discuss about CPR while on ventilator and no decision was made at this time.  Family states they want to be here when pt is extubated and will make a decision whether to reintubate at that time.  Family states they are leaning towards no reintubation.  Will continue goals of care discussions as pt is critically ill.

## 2014-12-26 NOTE — Consult Note (Signed)
Palliative Medicine Inpatient Consult Follow Up Note   Name: Lucas Ortega Date: 12/26/2014 MRN: [id number]  DOB: 29-Jul-1946  Referring Physician: Altamese DillingVaibhavkumar Vachhani, MD  Palliative Care consult requested for this 69 y.o. male for goals of medical therapy in patient with HTN, HLD, T2DM, gout, arthritis and current ETOH abuse.  Pt resting in bed on ventilator.  Pt appears in NAD.  Family not at bedside.    REVIEW OF SYSTEMS:  Patient is not able to provide ROS  CODE STATUS: Full code   PAST MEDICAL HISTORY: Past Medical History  Diagnosis Date  . Hypertension   . Hyperlipidemia   . Arthritis   . Gout     PAST SURGICAL HISTORY: History reviewed. No pertinent past surgical history.  Vital Signs: BP 159/88 mmHg  Pulse 68  Temp(Src) 99.4 F (37.4 C) (Oral)  Resp 23  Ht 5\' 6"  (1.676 m)  Wt 117.7 kg (259 lb 7.7 oz)  BMI 41.90 kg/m2  SpO2 100% Filed Weights   12/23/14 0610 12/24/14 0500 12/26/14 0506  Weight: 123.8 kg (272 lb 14.9 oz) 122.5 kg (270 lb 1 oz) 117.7 kg (259 lb 7.7 oz)    Estimated body mass index is 41.9 kg/(m^2) as calculated from the following:   Height as of this encounter: 5\' 6"  (1.676 m).   Weight as of this encounter: 117.7 kg (259 lb 7.7 oz).  PHYSICAL EXAM: Generall: Critically ill appearing HEENT: secretions, ETT/OGT present Neck: Trachea midline  Cardiovascular: reg rate and rhythm Pulmonary/Chest: Poor air movemnt ant fields, no audible wheeze Abdominal: Soft. Hypoactive bowel sounds GU: Foley present, clear yellow urine Extremities: + edema BLE's Neurological: unable to assess Skin: Warm, dry and intact.  Psychiatric: Unable to assess   LABS: CBC:    Component Value Date/Time   WBC 14.3* 12/23/2014 0408   HGB 10.9* 12/23/2014 0408   HCT 34.0* 12/23/2014 0408   PLT 267 12/23/2014 0408   MCV 80.0 12/23/2014 0408   NEUTROABS 23.5* 12/20/2014 1444   LYMPHSABS 1.9 12/31/2014 1444   MONOABS 2.2* 01/05/2015 1444   EOSABS 0.0  01/01/2015 1444   BASOSABS 0.0 12/13/2014 1444   Comprehensive Metabolic Panel:    Component Value Date/Time   NA 135 12/26/2014 0457   K 4.7 12/26/2014 0457   CL 102 12/26/2014 0457   CO2 24 12/26/2014 0457   BUN 10 12/26/2014 0457   CREATININE 1.27* 12/26/2014 0457   GLUCOSE 116* 12/26/2014 0457   CALCIUM 8.3* 12/26/2014 0457   AST 107* 12/23/2014 0408   ALT 29 12/23/2014 0408   ALKPHOS 67 12/23/2014 0408   BILITOT 1.4* 12/23/2014 0408   PROT 6.5 12/23/2014 0408   ALBUMIN 3.6 12/23/2014 0408    IMPRESSION:  [name] is a 69 y.o. Man with HTN, HLD, T2DM, gout, arthritis and current ETOH abuse. Pt presented to ER with altered mental status and L facial droop. Pt had seizure down in ER. ER workup significant for leukocytosis and MRI showed R pontine stroke. Pt currently in CCU. Family not at bedside.   Pt currently resting in bed on full vent support. 5 PEEP and 30% FiO2.  Family contacted and goals of care meeting scheduled at noon today.  See note that follows.  PLAN: See above    More than 50% of the visit was spent in counseling/coordination of care: YES  Time Spent: 25 minutes

## 2014-12-26 NOTE — Progress Notes (Signed)
Ptremains on vent. Vent settings as charted Sedated with precedx. Foley in place. UOP good.Lung sound coarse.IVF infusing as ordered. Resting in bed without any distress.Continue to observe closely.

## 2014-12-26 NOTE — Progress Notes (Signed)
[facility] [place] [facility]   PATIENT NAME: Lucas Ortega    MR#:  [id number]  DATE OF BIRTH:  [birth year]  SUBJECTIVE:  he had thick secretions and respi distress, so intubated for airway protection.  REVIEW OF SYSTEMS:    Review of Systems  Unable to perform ROS: intubated   DRUG ALLERGIES:   Allergies  Allergen Reactions  . Penicillins Other (See Comments)    Unknown reaction    VITALS:  Blood pressure 159/88, pulse 68, temperature 99.4 F (37.4 C), temperature source Oral, resp. rate 23, height 5\' 6"  (1.676 m), weight 117.7 kg (259 lb 7.7 oz), SpO2 100 %.  PHYSICAL EXAMINATION:   Physical Exam  Constitutional: No distress.  HENT:  Head: Normocephalic and atraumatic.  Neck: Normal range of motion. Neck supple. No JVD present.  Cardiovascular: Normal rate, regular rhythm and normal heart sounds.   No murmur heard. Pulmonary/Chest: Effort normal. No respiratory distress (think secretions from nose and mouth). He has no wheezes.  ET tube in place, vent support.  Abdominal: Bowel sounds are normal. He exhibits no distension and no mass. There is no tenderness.  Musculoskeletal: Edema: unable to do good exam but moves all extremities.  Neurological: Cranial nerve deficit: patient with facial droop, unable to do a neuro exam as the patient is lethargic.  Sedated on vent support.  Skin: Skin is warm. No rash noted. He is not diaphoretic.      LABORATORY PANEL:   CBC  Recent Labs Lab 12/23/14 0408  WBC 14.3*  HGB 10.9*  HCT 34.0*  PLT 267   ------------------------------------------------------------------------------------------------------------------  Chemistries   Recent Labs Lab 12/23/14 0408  12/26/14 0457  NA 135  < > 135  K 2.6*  < > 4.7  CL 94*  < > 102  CO2 31  < > 24  GLUCOSE 101*  < > 116*  BUN <5*  < > 10  CREATININE 1.09  < > 1.27*  CALCIUM 8.1*  < > 8.3*  MG 1.8  < > 1.5*  AST 107*  --   --   ALT 29   --   --   ALKPHOS 67  --   --   BILITOT 1.4*  --   --   < > = values in this interval not displayed. ------------------------------------------------------------------------------------------------------------------  Cardiac Enzymes  Recent Labs Lab 12/29/2014 1444  TROPONINI 0.04*   ------------------------------------------------------------------------------------------------------------------  RADIOLOGY:  Dg Chest 2 View  01/07/2015   CLINICAL DATA:  Possible aspiration.  Abnormal lung sounds.  EXAM: CHEST  2 VIEW  COMPARISON:  None.  FINDINGS: The heart size and mediastinal contours are within normal limits. Both lungs are clear. No pneumothorax or pleural effusion is noted. The visualized skeletal structures are unremarkable.  IMPRESSION: No active cardiopulmonary disease.   Electronically Signed   By: Lupita RaiderJames  Green Jr, M.D.   On: 01/04/2015 15:02   Ct Head (brain) Wo Contrast  12/08/2014   IMPRESSION: Minimal diffuse cortical atrophy. Mild chronic ischemic white matter disease. No acute intracranial abnormality seen. These results were called by telephone at the time of interpretation on 12/27/2014 at 2:42 pm to Dr. Lenard LancePaduchowski, who verbally acknowledged these results.   Electronically Signed   By: Lupita RaiderJames  Green Jr, M.D.   On: 12/21/2014 14:44   Portable Chest 1 View  12/23/2014   IMPRESSION: Cardiomegaly  Calcified tortuous aorta  No obvious infiltrate, congestive heart failure or pneumothorax.  Evaluation limited by portable technique and patient's  habitus.   Electronically Signed   By: Lacy DuverneySteven  Olson M.D.   On: 12/23/2014 07:31     ASSESSMENT AND PLAN:    This is 69 year old male with a history of hypertension and gout he was found by his family with left facial droop and left-sided weakness. He also has been drinking EtOH heavily. He had a seizure in the ER.  1. Stroke:  Patient has been seen and evaluated by neurology. Patient has a right pontine infarct. Patient has swallowing  issues. The etiology is likely small vessel disease from uncontrolled diabetes and hypertension. Intubated continue with aspirin and statin.  2. Seizure: patient had a witnessed seizure in the emergency department. I suspect this is related to alcohol drinking.   Neurology recommends continuing Keppra 500 mg for 3 days then stop.   3. EtOH dependence: Patient is on CIWA protocol.   4.. Electrolyte abnormalities: pharmacy for assistance with electrolyte protocol.  5. Essential hypertension: Patient is on lisinopril and Norvasc at home,   Here on hydralazine when necessary.  6. Acute Respiratory failure :    Due to secretions - intubated for airway protection.   Pulmonary to manage vent support,.    Management plans discussed with the patient's sister who is in agreement.  CODE STATUS FULL  TOTAL TIME TAKING CARE OF THIS PATIENT: 40 min critical care.    Altamese DillingVACHHANI, Daneli Butkiewicz M.D on 12/26/2014 at 7:39 AM  Between 7am to 6pm - Pager - [phone] After 6pm go to [email or web address] - password [email or web address] [facility]  [facility]  Office  [phone]  CC: Primary care physician; Ashley MarinerGONZALEZ, CHRISTINA MARIE, MD

## 2014-12-26 NOTE — Care Management (Signed)
Patient required intubation 5/18 and is now on full ventilator support.  Palliative care will meet with patient's sisters today to discuss

## 2014-12-27 ENCOUNTER — Inpatient Hospital Stay: Payer: Medicare Other

## 2014-12-27 LAB — CBC
HEMATOCRIT: 32.5 % — AB (ref 40.0–52.0)
Hemoglobin: 10.3 g/dL — ABNORMAL LOW (ref 13.0–18.0)
MCH: 25.7 pg — ABNORMAL LOW (ref 26.0–34.0)
MCHC: 31.6 g/dL — ABNORMAL LOW (ref 32.0–36.0)
MCV: 81.5 fL (ref 80.0–100.0)
Platelets: 261 10*3/uL (ref 150–440)
RBC: 3.99 MIL/uL — ABNORMAL LOW (ref 4.40–5.90)
RDW: 19 % — ABNORMAL HIGH (ref 11.5–14.5)
WBC: 18.2 10*3/uL — AB (ref 3.8–10.6)

## 2014-12-27 LAB — LIPID PANEL
CHOLESTEROL: 104 mg/dL (ref 0–200)
HDL: 30 mg/dL — ABNORMAL LOW (ref >40)
LDL Cholesterol: 53 mg/dL (ref 0–99)
Total CHOL/HDL Ratio: 3.5 RATIO
Triglycerides: 106 mg/dL (ref <150)
VLDL: 21 mg/dL (ref 0–40)

## 2014-12-27 LAB — CULTURE, BLOOD (ROUTINE X 2)
CULTURE: NO GROWTH
Culture: NO GROWTH

## 2014-12-27 LAB — BASIC METABOLIC PANEL
ANION GAP: 6 (ref 5–15)
BUN: 12 mg/dL (ref 6–20)
CALCIUM: 8.1 mg/dL — AB (ref 8.9–10.3)
CO2: 24 mmol/L (ref 22–32)
CREATININE: 1.61 mg/dL — AB (ref 0.61–1.24)
Chloride: 104 mmol/L (ref 101–111)
GFR, EST AFRICAN AMERICAN: 49 mL/min — AB (ref >60)
GFR, EST NON AFRICAN AMERICAN: 42 mL/min — AB (ref >60)
GLUCOSE: 108 mg/dL — AB (ref 65–99)
Potassium: 6.5 mmol/L — ABNORMAL HIGH (ref 3.5–5.1)
Sodium: 134 mmol/L — ABNORMAL LOW (ref 135–145)

## 2014-12-27 LAB — GLUCOSE, CAPILLARY
GLUCOSE-CAPILLARY: 85 mg/dL (ref 65–99)
GLUCOSE-CAPILLARY: 84 mg/dL (ref 65–99)
Glucose-Capillary: 104 mg/dL — ABNORMAL HIGH (ref 65–99)
Glucose-Capillary: 135 mg/dL — ABNORMAL HIGH (ref 65–99)
Glucose-Capillary: 57 mg/dL — ABNORMAL LOW (ref 65–99)
Glucose-Capillary: 115 mg/dL — ABNORMAL HIGH (ref 65–99)

## 2014-12-27 LAB — RENAL FUNCTION PANEL
ANION GAP: 6 (ref 5–15)
Albumin: 2.2 g/dL — ABNORMAL LOW (ref 3.5–5.0)
BUN: 17 mg/dL (ref 6–20)
CALCIUM: 8 mg/dL — AB (ref 8.9–10.3)
CO2: 24 mmol/L (ref 22–32)
Chloride: 105 mmol/L (ref 101–111)
Creatinine, Ser: 2.12 mg/dL — ABNORMAL HIGH (ref 0.61–1.24)
GFR, EST AFRICAN AMERICAN: 35 mL/min — AB (ref >60)
GFR, EST NON AFRICAN AMERICAN: 30 mL/min — AB (ref >60)
GLUCOSE: 104 mg/dL — AB (ref 65–99)
PHOSPHORUS: 6.6 mg/dL — AB (ref 2.5–4.6)
Potassium: 6 mmol/L — ABNORMAL HIGH (ref 3.5–5.1)
SODIUM: 135 mmol/L (ref 135–145)

## 2014-12-27 LAB — MAGNESIUM
Magnesium: 2.1 mg/dL (ref 1.7–2.4)
Magnesium: 2.2 mg/dL (ref 1.7–2.4)

## 2014-12-27 LAB — PHOSPHORUS: Phosphorus: 5.9 mg/dL — ABNORMAL HIGH (ref 2.5–4.6)

## 2014-12-27 LAB — POTASSIUM: POTASSIUM: 6.8 mmol/L — AB (ref 3.5–5.1)

## 2014-12-27 MED ORDER — SODIUM CHLORIDE 0.9 % IV SOLN
1250.0000 mg | Freq: Once | INTRAVENOUS | Status: AC
  Administered 2014-12-27: 1250 mg via INTRAVENOUS
  Filled 2014-12-27: qty 1250

## 2014-12-27 MED ORDER — NEPRO/CARBSTEADY PO LIQD
1000.0000 mL | ORAL | Status: DC
  Administered 2014-12-27 – 2014-12-29 (×4): 1000 mL via ORAL

## 2014-12-27 MED ORDER — CLINDAMYCIN PHOSPHATE 600 MG/50ML IV SOLN
600.0000 mg | Freq: Three times a day (TID) | INTRAVENOUS | Status: DC
  Administered 2014-12-27 – 2014-12-30 (×9): 600 mg via INTRAVENOUS
  Filled 2014-12-27 (×13): qty 50

## 2014-12-27 MED ORDER — DEXTROSE 5 % IV SOLN
2.0000 g | Freq: Once | INTRAVENOUS | Status: DC
  Filled 2014-12-27: qty 2

## 2014-12-27 MED ORDER — HEPARIN SODIUM (PORCINE) 1000 UNIT/ML DIALYSIS
1000.0000 [IU] | INTRAMUSCULAR | Status: DC | PRN
  Filled 2014-12-27 (×4): qty 6

## 2014-12-27 MED ORDER — ALTEPLASE 100 MG IV SOLR
2.0000 mg | Freq: Once | INTRAVENOUS | Status: AC
  Administered 2014-12-27: 2 mg

## 2014-12-27 MED ORDER — CALCIUM GLUCONATE 10 % IV SOLN
1.0000 g | Freq: Once | INTRAVENOUS | Status: AC
  Administered 2014-12-27: 1 g via INTRAVENOUS
  Filled 2014-12-27: qty 10

## 2014-12-27 MED ORDER — VITAL HIGH PROTEIN PO LIQD: 1000.0000 mL | ORAL | Status: DC

## 2014-12-27 MED ORDER — SODIUM POLYSTYRENE SULFONATE 15 GM/60ML PO SUSP
60.0000 g | Freq: Once | ORAL | Status: AC
  Administered 2014-12-27: 60 g
  Filled 2014-12-27: qty 240

## 2014-12-27 MED ORDER — DEXTROSE 50 % IV SOLN
50.0000 mL | INTRAVENOUS | Status: AC
  Administered 2014-12-27: 50 mL via INTRAVENOUS

## 2014-12-27 MED ORDER — SODIUM POLYSTYRENE SULFONATE 15 GM/60ML PO SUSP
30.0000 g | Freq: Once | ORAL | Status: AC
  Administered 2014-12-27: 30 g
  Filled 2014-12-27: qty 120

## 2014-12-27 MED ORDER — DEXTROSE 50 % IV SOLN
INTRAVENOUS | Status: AC
  Filled 2014-12-27: qty 50

## 2014-12-27 MED ORDER — ALTEPLASE 100 MG IV SOLR
2.0000 mg | Freq: Once | INTRAVENOUS | Status: AC
  Administered 2014-12-27: 2 mg
  Filled 2014-12-27: qty 2

## 2014-12-27 MED ORDER — FAMOTIDINE IN NACL 20-0.9 MG/50ML-% IV SOLN
20.0000 mg | INTRAVENOUS | Status: DC
  Administered 2014-12-28 – 2015-01-01 (×5): 20 mg via INTRAVENOUS
  Filled 2014-12-27 (×6): qty 50

## 2014-12-27 MED ORDER — DOCUSATE SODIUM 50 MG/5ML PO LIQD
100.0000 mg | Freq: Two times a day (BID) | ORAL | Status: DC
  Administered 2014-12-27 – 2014-12-28 (×3): 100 mg via ORAL
  Filled 2014-12-27 (×3): qty 10

## 2014-12-27 MED ORDER — PUREFLOW DIALYSIS SOLUTION: INTRAVENOUS | Status: DC

## 2014-12-27 MED ORDER — INSULIN ASPART 100 UNIT/ML IV SOLN
10.0000 [IU] | Freq: Once | INTRAVENOUS | Status: AC
  Administered 2014-12-27: 10 [IU] via INTRAVENOUS
  Filled 2014-12-27 (×2): qty 0.1

## 2014-12-27 MED ORDER — INSULIN ASPART 100 UNIT/ML IV SOLN
10.0000 [IU] | Freq: Once | INTRAVENOUS | Status: AC
  Administered 2014-12-27: 10 [IU] via INTRAVENOUS
  Filled 2014-12-27: qty 0.1

## 2014-12-27 NOTE — Progress Notes (Signed)
CRRT Medication Review Patient to start CRRT for worsening renal failure and hyperkalemia.  Reviewed current medication orders. Will decrease Famotidine to q24h interval.  Lucas CuffLisa Tinesha Siegrist, PharmD, BCPS 12/27/2014 10:09 PM

## 2014-12-27 NOTE — Progress Notes (Signed)
NEUROLOGY NOTE  S: still intubated, no clinical changes  ROS unobtainable secondary to intubation  O: AFVSS NAD, overweight Normocephalic, oropharynx with thick secretions supple, no lymphadenoopathy Coarse BS B, no wheezing tachycardic, no murmurs  Intubated, sedated, opens but does not follow, GCS 8T PERRLA, + corneals B, good cough Withdrawals to pain on R>L  Labs reviewed by me   Impression: 1. Pontine infarct- Stable exam except for swallowing issues but limited today 2. Dysphagia- unable to assess now - Recommend weaning sedation soon - HOB 60 degrees - continue Robinul to .2mg  q4h - continue Zyrtec 5mg  BID - Continue ASA and statin - Will likely need PEG - Will sign off, please call with questions -  [facility]

## 2014-12-27 NOTE — Progress Notes (Signed)
Notified Dr. Belia HemanKasa of patients elevated potassium of 6.8 and Michael with pharmacy. New orders received for calcium gluconate, insulin, and kayexalate. Dr. Belia HemanKasa also had me consult Dr. Cherylann RatelLateef for worsening renal function and elevated potassium. Dr. Cherylann RatelLateef also notified and aware. Will continue to assess and monitor.

## 2014-12-27 NOTE — Progress Notes (Signed)
[facility] [place] [facility]   PATIENT NAME: Lucas Ortega    MR#:  [id number]  DATE OF BIRTH:  02/22/1946  SUBJECTIVE:  he had thick secretions and respi distress, so intubated for airway protection on 12/25/14  REVIEW OF SYSTEMS:   Intubated and sedated on vent support. ROS DRUG ALLERGIES:   Allergies  Allergen Reactions  . Penicillins Other (See Comments)    Unknown reaction    VITALS:  Blood pressure 106/63, pulse 87, temperature 99.2 F (37.3 C), temperature source Oral, resp. rate 16, height 5\' 6"  (1.676 m), weight 118.9 kg (262 lb 2 oz), SpO2 100 %.  PHYSICAL EXAMINATION:   Physical Exam  Constitutional: No distress.  HENT:  Head: Normocephalic and atraumatic.  Neck: Normal range of motion. Neck supple. No JVD present.  Cardiovascular: Normal rate, regular rhythm and normal heart sounds.   No murmur heard. Pulmonary/Chest: Effort normal. No respiratory distress (think secretions from nose and mouth). He has no wheezes.  ET tube in place, vent support.  Abdominal: Bowel sounds are normal. He exhibits no distension and no mass. There is no tenderness.  Neurological:  Sedated on vent support.  Skin: Skin is warm. No rash noted. He is not diaphoretic.    LABORATORY PANEL:   CBC  Recent Labs Lab 12/27/14 0443  WBC 18.2*  HGB 10.3*  HCT 32.5*  PLT 261   ------------------------------------------------------------------------------------------------------------------  Chemistries   Recent Labs Lab 12/23/14 0408  12/27/14 1805  NA 135  < > 135  K 2.6*  < > 6.0*  CL 94*  < > 105  CO2 31  < > 24  GLUCOSE 101*  < > 104*  BUN <5*  < > 17  CREATININE 1.09  < > 2.12*  CALCIUM 8.1*  < > 8.0*  MG 1.8  < > 2.2  AST 107*  --   --   ALT 29  --   --   ALKPHOS 67  --   --   BILITOT 1.4*  --   --   < > = values in this interval not  displayed. ------------------------------------------------------------------------------------------------------------------  Cardiac Enzymes  Recent Labs Lab 12/13/2014 1444  TROPONINI 0.04*   ------------------------------------------------------------------------------------------------------------------  RADIOLOGY:  Dg Chest 2 View  12/13/2014   CLINICAL DATA:  Possible aspiration.  Abnormal lung sounds.  EXAM: CHEST  2 VIEW  COMPARISON:  None.  FINDINGS: The heart size and mediastinal contours are within normal limits. Both lungs are clear. No pneumothorax or pleural effusion is noted. The visualized skeletal structures are unremarkable.  IMPRESSION: No active cardiopulmonary disease.   Electronically Signed   By: Lupita RaiderJames  Green Jr, M.D.   On: 12/18/2014 15:02   Ct Head (brain) Wo Contrast  12/21/2014   IMPRESSION: Minimal diffuse cortical atrophy. Mild chronic ischemic white matter disease. No acute intracranial abnormality seen. These results were called by telephone at the time of interpretation on 12/24/2014 at 2:42 pm to Dr. Lenard LancePaduchowski, who verbally acknowledged these results.   Electronically Signed   By: Lupita RaiderJames  Green Jr, M.D.   On: 12/28/2014 14:44   Portable Chest 1 View  12/23/2014   IMPRESSION: Cardiomegaly  Calcified tortuous aorta  No obvious infiltrate, congestive heart failure or pneumothorax.  Evaluation limited by portable technique and patient's habitus.   Electronically Signed   By: Lacy DuverneySteven  Olson M.D.   On: 12/23/2014 07:31     ASSESSMENT AND PLAN:    This is 69 year old male with a history of  hypertension and gout he was found by his family with left facial droop and left-sided weakness. He also has been drinking EtOH heavily. He had a seizure in the ER. * Aspiration pneumonia   Had fever and rise in WBcs    Start clindamycin, need sputum cx.   Pulmonary on case.  * Stroke:  Patient has been seen and evaluated by neurology. Patient has a right pontine infarct.    has swallowing issues. The etiology is likely small vessel disease from uncontrolled diabetes and hypertension. Intubated now, continue with aspirin and statin.  * Seizure: patient had a witnessed seizure in the emergency department. I suspect this is related to alcohol drinking.   Neurology recommends continuing Keppra 500 mg for 3 days then stop.  * hyperkalemia   Also haveworsening renal function.   Given kayexalate- follow K later.  *  EtOH dependence: Patient is on CIWA protocol. On sedation now.  * Electrolyte abnormalities: pharmacy for assistance with electrolyte protocol.  * Essential hypertension: Patient is on lisinopril and Norvasc at home,   Here on hydralazine when necessary.  * Acute Respiratory failure :    Due to secretions - intubated for airway protection.   Pulmonary to manage vent support,.now have pneumonia.   Palliative care had a meeting with family, they would like everything to be done now.   He may need trach and PEG.    Management plans discussed with the patient's sister who is in agreement.  CODE STATUS FULL  TOTAL TIME TAKING CARE OF THIS PATIENT: 40 min critical care.    Altamese DillingVACHHANI, Nayara Taplin M.D on 12/27/2014 at 10:46 PM  Between 7am to 6pm - Pager - [phone] After 6pm go to [email or web address] - password [email or web address] [facility]  [facility]  Office  [phone]  CC: Primary care physician; Ashley MarinerGONZALEZ, CHRISTINA MARIE, MD

## 2014-12-27 NOTE — Consult Note (Signed)
PULMONARY / CRITICAL CARE MEDICINE   Name: Lucas Ortega MRN: [id number] DOB: 1945/11/14    ADMISSION DATE:  12/17/2014   CHIEF COMPLAINT:   Acute CVA with inability to clear secretions   SUBJECTIVE   Patient remains intubated,sedated. Febrile episode-findings c/w aspiration pneumonia Critically ill, will change sedatives  On full vent support-fio2 increased to 80%       PAST MEDICAL HISTORY    :  Past Medical History  Diagnosis Date  . Hypertension   . Hyperlipidemia   . Arthritis   . Gout    History reviewed. No pertinent past surgical history. Prior to Admission medications   Medication Sig Start Date End Date Taking? Authorizing Provider  amLODipine (NORVASC) 5 MG tablet Take 5 mg by mouth daily.   Yes Historical Provider, MD  colchicine 0.6 MG tablet Take 0.6 mg by mouth daily.   Yes Historical Provider, MD  lisinopril (PRINIVIL,ZESTRIL) 40 MG tablet Take 40 mg by mouth daily.   Yes Historical Provider, MD  Multiple Vitamins-Minerals (CENTRUM SILVER ADULT 50+) TABS Take 1 tablet by mouth daily.   Yes Historical Provider, MD  vitamin B-12 (CYANOCOBALAMIN) 1000 MCG tablet Take 1,000 mcg by mouth daily.   Yes Historical Provider, MD   Allergies  Allergen Reactions  . Penicillins Other (See Comments)    Unknown reaction     FAMILY HISTORY   Family History  Problem Relation Age of Onset  . Family history unknown: Yes      SOCIAL HISTORY    reports that he has never smoked. He does not have any smokeless tobacco history on file. He reports that he drinks alcohol. He reports that he does not use illicit drugs.  Review of Systems  Unable to perform ROS: critical illness      VITAL SIGNS    Temp:  [98.7 F (37.1 C)-101.2 F (38.4 C)] 101.2 F (38.4 C) (05/20 0735) Pulse Rate:  [63-124] 114 (05/20 0900) Resp:  [15-26] 16 (05/20 0900) BP: (88-171)/(55-99) 122/60 mmHg (05/20 0900) SpO2:  [92 %-100 %] 100 % (05/20 0900) FiO2 (%):  [30 %-100 %]  100 % (05/20 0800) Weight:  [262 lb 2 oz (118.9 kg)] 262 lb 2 oz (118.9 kg) (05/20 0500) HEMODYNAMICS:   VENTILATOR SETTINGS: Vent Mode:  [-] PRVC FiO2 (%):  [30 %-100 %] 100 % Set Rate:  [16 bmp] 16 bmp Vt Set:  [500 mL] 500 mL PEEP:  [5 cmH20] 5 cmH20 INTAKE / OUTPUT:  Intake/Output Summary (Last 24 hours) at 12/27/14 0936 Last data filed at 12/27/14 0900  Gross per 24 hour  Intake 4568.7 ml  Output   1758 ml  Net 2810.7 ml       PHYSICAL EXAM   Physical Exam  Constitutional: He appears distressed.  Increased oral secretions  HENT:  Head: Normocephalic and atraumatic.  Eyes: Pupils are equal, round, and reactive to light. No scleral icterus.  Neck: Normal range of motion. Neck supple.  Cardiovascular: Normal rate and regular rhythm.   No murmur heard. Pulmonary/Chest: No respiratory distress. He has no wheezes. He has rales.  resp distress  Abdominal: Soft. He exhibits no distension. There is no tenderness.  Musculoskeletal: He exhibits no edema.  Neurological: He displays normal reflexes. Coordination normal.  Gcs<8T   Skin: Skin is warm. No rash noted. He is diaphoretic.       LABS   LABS:  CBC  Recent Labs Lab 12/31/2014 1444 12/23/14 0408 12/27/14 0443  WBC 27.6* 14.3* 18.2*  HGB 11.8* 10.9* 10.3*  HCT 37.8* 34.0* 32.5*  PLT 283 267 261   Coag's  Recent Labs Lab 12/23/14 0408  INR 1.09   BMET  Recent Labs Lab 12/25/14 0448 12/26/14 0457 12/27/14 0443  NA 141 135 134*  K 4.7 4.7 6.5*  CL 107 102 104  CO2 26 24 24   BUN 8 10 12   CREATININE 1.17 1.27* 1.61*  GLUCOSE 95 116* 108*   Electrolytes  Recent Labs Lab 12/25/14 0448 12/26/14 0457 12/27/14 0443  CALCIUM 8.4* 8.3* 8.1*  MG 1.7 1.5* 2.1  PHOS 3.1 2.4* 5.9*   Sepsis Markers  Recent Labs Lab 04-21-2015 1444 04-21-2015 2008 04-21-2015 2344  LATICACIDVEN 16.4* 5.1* 1.9   ABG  Recent Labs Lab 12/24/14 1140  PHART 7.44  PCO2ART 41  PO2ART 75*   Liver  Enzymes  Recent Labs Lab 04-21-2015 1444 12/23/14 0408  AST 128* 107*  ALT 32 29  ALKPHOS 80 67  BILITOT 1.7* 1.4*  ALBUMIN 4.0 3.6   Cardiac Enzymes  Recent Labs Lab 04-21-2015 1444  TROPONINI 0.04*   Glucose  Recent Labs Lab 12/23/14 0750 12/24/14 0727 12/25/14 1053 12/26/14 0716 12/26/14 1614 12/27/14 0753  GLUCAP 123* 119* 112* 129* 100* 85     Recent Results (from the past 240 hour(s))  Blood culture (routine x 2)     Status: None   Collection Time: 04-21-2015  2:44 PM  Result Value Ref Range Status   Specimen Description BLOOD  Final   Special Requests BLOOD  Final   Culture NO GROWTH 5 DAYS  Final   Report Status 12/27/2014 FINAL  Final  Blood culture (routine x 2)     Status: None   Collection Time: 04-21-2015  3:02 PM  Result Value Ref Range Status   Specimen Description BLOOD  Final   Special Requests BLOOD  Final   Culture NO GROWTH 5 DAYS  Final   Report Status 12/27/2014 FINAL  Final      IMAGING    No results found.      ASSESSMENT/PLAN   69 yo AAm admitted to ICU for acute CVA with inability to clear secretions intubated for resp failure. Acute DT's.   PULMONARY- -Respiratory Failure -continue Full MV support -continue Bronchodilator Therapy -Wean Fio2 and PEEP as tolerated -sputum culture  CARDIOVASCULAR -needs ICU monitoring  RENAL -watch UO  GASTROINTESTINAL -Needs PEG tube for survival -started OG tube feeds  HEMATOLOGIC -follow h/h  INFECTIOUS-ASPIRATION PNEUMONIA -started ABX -sputum and blood cultures  ENDOCRINE Follow FSBS  NEUROLOGIC encephalopathy from Dt's and acute CVA - CIWA protocol -bannana bag infusion  Palliative care team consulted, recommend DNR status and wean to extubate as some point    I have personally obtained a history, examined the patient, evaluated laboratory and imaging results, formulated the assessment and plan and placed orders.  The Patient requires high complexity decision  making for assessment and support, frequent evaluation and titration of therapies, application of advanced monitoring technologies and extensive interpretation of multiple databases. Critical Care Time devoted to patient care services described in this note is 40 minutes.   Overall, patient is critically ill, prognosis is guarded. Patient at high risk for cardiac arrest and death.   Lucie LeatherKurian David Jesselle Laflamme, M.D. Pulmonary & Critical Care Medicine [facility] [place] Medical Director Intensive Care Unit   12/27/2014, 9:36 AM

## 2014-12-27 NOTE — Progress Notes (Signed)
Pt remains on vent. Vent settings as charted. Sedated with propofol and fentanyal. Responsive only painful stimulai.foley in place. UOP is good.. Temp.99.6.tube feeding is in progress.Resting comfortablyin bed..Marland Kitchen

## 2014-12-27 NOTE — Progress Notes (Addendum)
PHARMACY - CRITICAL CARE PROGRESS NOTE  Pharmacy Consult for Electrolyte management and Constipation prevention  Indication: ICU status requiring mechanical ventilation     Allergies  Allergen Reactions  . Penicillins Other (See Comments)    Unknown reaction    Patient Measurements: Height: 5\' 6"  (167.6 cm) Weight: 262 lb 2 oz (118.9 kg) IBW/kg (Calculated) : 63.8   Vital Signs: Temp: 100.3 F (37.9 C) (05/20 1200) Temp Source: Oral (05/20 1200) BP: 97/57 mmHg (05/20 1400) Pulse Rate: 101 (05/20 1400) Intake/Output from previous day: 05/19 0701 - 05/20 0700 In: 4162.8 [I.V.:3847.8; NG/GT:265; IV Piggyback:50] Out: 1758 [Urine:1758] Intake/Output from this shift: Total I/O In: 1403.9 [I.V.:738.9; NG/GT:315; IV Piggyback:350] Out: -  Vent settings for last 24 hours: Vent Mode:  [-] PRVC FiO2 (%):  [30 %-100 %] 50 % Set Rate:  [16 bmp] 16 bmp Vt Set:  [500 mL] 500 mL PEEP:  [5 cmH20] 5 cmH20  Labs:  Recent Labs  12/25/14 0448 12/26/14 0457 12/27/14 0443  WBC  --   --  18.2*  HGB  --   --  10.3*  HCT  --   --  32.5*  PLT  --   --  261  CREATININE 1.17 1.27* 1.61*  MG 1.7 1.5* 2.1  PHOS 3.1 2.4* 5.9*   Estimated Creatinine Clearance: 52.6 mL/min (by C-G formula based on Cr of 1.61).   Recent Labs  12/26/14 1614 12/27/14 0753 12/27/14 1205  GLUCAP 100* 85 84    Microbiology: Recent Results (from the past 720 hour(s))  Blood culture (routine x 2)     Status: None   Collection Time: 2015/02/12  2:44 PM  Result Value Ref Range Status   Specimen Description BLOOD  Final   Special Requests BLOOD  Final   Culture NO GROWTH 5 DAYS  Final   Report Status 12/27/2014 FINAL  Final  Blood culture (routine x 2)     Status: None   Collection Time: 2015/02/12  3:02 PM  Result Value Ref Range Status   Specimen Description BLOOD  Final   Special Requests BLOOD  Final   Culture NO GROWTH 5 DAYS  Final   Report Status 12/27/2014 FINAL  Final    Medications:   Scheduled:  . albuterol  2.5 mg Nebulization Q6H  . aspirin  81 mg Oral Daily  . atorvastatin  80 mg Oral q1800  . calcium gluconate  1 g Intravenous Once  . chlorhexidine  15 mL Mouth/Throat BID  . clindamycin (CLEOCIN) IV  600 mg Intravenous 3 times per day  . docusate  100 mg Oral BID  . famotidine (PEPCID) IV  20 mg Intravenous Q12H  . free water  25 mL Per Tube 6 times per day  . heparin  5,000 Units Subcutaneous 3 times per day  . insulin aspart  10 Units Intravenous Once  . metoprolol  5 mg Intravenous 4 times per day  . sennosides  10 mL Oral BID  . sodium chloride  3 mL Intravenous Q12H  . sodium polystyrene  60 g Per Tube Once   Infusions:  . feeding supplement (VITAL HIGH PROTEIN) 1,000 mL (12/27/14 1540)  . fentaNYL infusion INTRAVENOUS 300 mcg/hr (12/27/14 1328)  . propofol (DIPRIVAN) infusion 8 mcg/kg/min (12/27/14 1327)  . banana bag IV 1000 mL 40 mL/hr at 12/27/14 1540    Assessment: 69 yo male critical care patient requiring mechanical ventilation. Patient with worsening renal function and continued hyperkalemia. Patient received 30g of SPS this  am. Follow-up potassium was 6.8. Nephrology has been consulted.    Plan:   1. Electrolytes - will order 60g of SPS, calcium gluconate 1g IV x 1 and 10 units of IV insulin. Spoke with rn and will obtain follow-up blood glucose ~ 1hr after IV insulin. Will obtain follow-up potassium at 2000.    2. Constipation - patient currently ordered docusate 100mg  VT BID and senna 10mL VT BID. Patient has not had documented bowel movement, but has now received two doses of SPS. Will continue with current regimen.    Simpson,Michael L 12/27/2014,3:51 PM

## 2014-12-27 NOTE — Progress Notes (Signed)
Nutrition Follow-up  DOCUMENTATION CODES:     INTERVENTION:  EN: Received call from RN Elita QuickPam; MD Cherylann RatelLateef requesting TF to be switched to Nepro secondary to electrolytes.  Will recommend starting Nepro at 3935mL/hr at this time to provide 1512kcals (100% estimated energy needs) and 68g protein (50% of protein needs).  Will reassess need for additional Protstat pending diprivan rate tomorrow.  NUTRITION DIAGNOSIS:   Inadequate oral intake related to inability to eat as evidenced by NPO status.  GOAL:  Provide needs based on ASPEN/SCCM guidelines (EN: Goal for pt to tolerate intiation of TF and titration to goal within 24-48 hours)  MONITOR:   (EN, Electrolyte and Renal Profile, Digestive system, Pulmonary Profile)  REASON FOR ASSESSMENT:   (follow-up) Enteral/tube feeding initiation and management  Electrolyte and Renal Profile:    Recent Labs Lab 12/25/14 0448 12/26/14 0457 12/27/14 0443 12/27/14 1447  BUN 8 10 12   --   CREATININE 1.17 1.27* 1.61*  --   NA 141 135 134*  --   K 4.7 4.7 6.5* 6.8*  MG 1.7 1.5* 2.1  --   PHOS 3.1 2.4* 5.9*  --     Estimated Nutritional Needs:  Kcal:  1298-1652kcals, (11-14kcals/kg) using acutal body weight of 118kg  Protein:  129-161g protein (2.0-2.5g/kg) using IBW of 64.5kg  Fluid:  1613-192335mL of fluid (25-6230mL/kg) using IBW of 64.5kg  Skin:  Reviewed, no issues  Diet Order:  Diet NPO time specified  EDUCATION NEEDS:  No education needs identified at this time   Intake/Output Summary (Last 24 hours) at 12/27/14 1644 Last data filed at 12/27/14 1600  Gross per 24 hour  Intake 6071.23 ml  Output   1730 ml  Net 4341.23 ml    HIGH Care Level  Lucas Ortega, RD, LDN Pager [phone]

## 2014-12-27 NOTE — Progress Notes (Signed)
   12/27/14 1115  Clinical Encounter Type  Visited With Patient  Visit Type Follow-up  Consult/Referral To Chaplain  Spiritual Encounters  Spiritual Needs Emotional  Chaplain rounded in unit and offered a compassionate presence to the patient. Chaplain Sebrina Kessner A. Meha Vidrine [phone]

## 2014-12-27 NOTE — Care Management (Signed)
Family has stated that at present, want "everything done"  At least for a few more days to "see how patient does."

## 2014-12-27 NOTE — Op Note (Signed)
  OPERATIVE NOTE   PROCEDURE: 1. Ultrasound guidance for vascular access right femoral vein 2. Placement of a 30 cm Duoglide dialysis catheter right femoral vein  PRE-OPERATIVE DIAGNOSIS: 1. Acute renal failure 2. hyperkalemia  POST-OPERATIVE DIAGNOSIS: Same  SURGEON: Festus BarrenJason Dew, MD  ASSISTANT(S): Raul DelKim Stegmayer, PA-C  ANESTHESIA: local  ESTIMATED BLOOD LOSS: Minimal   FINDING(S): 1. None  SPECIMEN(S): None  INDICATIONS:  Patient is a 69 yo AAM who presents with  AMS.  He is intubated and sedated.  He has ARF with hyperkalemia and requires immediate dialysis access.  We are asked to place a dialysis catheter.  He has a left jugular TLC already.  We will save the right jugular for permcath in case renal function does not improve.  Risks and benefits were discussed with family, and informed consent was obtained..  DESCRIPTION: After obtaining full informed written consent, the patient was laid flat in the bed. The right groin was sterilely prepped and draped in a sterile surgical field was created. The right femoral vein was visualized with ultrasound and found to be widely patent. It was then accessed under direct guidance without difficulty with a Seldinger needle and a permanent image was recorded. A J-wire was then placed. After skin nick and dilatation, a 30 cm long Duoglide dialysis catheter was placed over the wire and the wire was removed. The lumens withdrew dark red nonpulsatile blood and flushed easily with sterile saline. The catheter was secured to the skin with 3 nylon sutures. Sterile dressing was placed.  COMPLICATIONS: None  CONDITION: Stable  DEW,JASON 12/27/2014 6:05 PM

## 2014-12-27 NOTE — Consult Note (Signed)
[facility] Vascular Consult Note  MRN : [id number]  Lucas Ortega is a 69 y.o. (15-Feb-1946) male who presents with chief complaint of  Chief Complaint  Patient presents with  . Altered Mental Status  . Facial Droop  . Hyperglycemia  .  History of Present Illness: Patient is admitted to the hospital with AMS and possible stroke and has developed acute renal failure.  The patient is unable to provide history as he is intubated and sedated and no family at bedside.  History obtained from medical record.  The patient is critically ill with development of ARF. The nephrology service has decided to initiate dialysis at this time, and we are asked to place a temporary dialysis catheter for immediate dialysis use due to severe hyperkalemia.    Current Facility-Administered Medications  Medication Dose Route Frequency Provider Last Rate Last Dose  . acetaminophen (TYLENOL) tablet 650 mg  650 mg Oral Q6H PRN Marguarite ArbourJeffrey D Sparks, MD   650 mg at 12/26/14 0434   Or  . acetaminophen (TYLENOL) suppository 650 mg  650 mg Rectal Q6H PRN Marguarite ArbourJeffrey D Sparks, MD      . albuterol (PROVENTIL) (2.5 MG/3ML) 0.083% nebulizer solution 2.5 mg  2.5 mg Nebulization Q6H Marguarite ArbourJeffrey D Sparks, MD   2.5 mg at 12/27/14 1411  . aspirin chewable tablet 81 mg  81 mg Oral Daily Sital Mody, MD   81 mg at 12/27/14 1004  . atorvastatin (LIPITOR) tablet 80 mg  80 mg Oral q1800 Erin FullingKurian Kasa, MD   80 mg at 12/27/14 1758  . chlorhexidine (PERIDEX) 0.12 % solution 15 mL  15 mL Mouth/Throat BID Erin FullingKurian Kasa, MD   15 mL at 12/27/14 0806  . clindamycin (CLEOCIN) IVPB 600 mg  600 mg Intravenous 3 times per day Erin FullingKurian Kasa, MD   600 mg at 12/27/14 1055  . docusate (COLACE) 50 MG/5ML liquid 100 mg  100 mg Oral BID Altamese DillingVaibhavkumar Vachhani, MD   100 mg at 12/27/14 1056  . famotidine (PEPCID) IVPB 20 mg premix  20 mg Intravenous Q12H Erin FullingKurian Kasa, MD   20 mg at 12/27/14 1002  . feeding supplement (NEPRO CARB STEADY) liquid 1,000 mL   1,000 mL Oral Continuous Munsoor Lateef, MD      . fentaNYL 2500mcg in NS 250mL (7310mcg/ml) infusion-PREMIX  10 mcg/hr Intravenous Continuous Erin FullingKurian Kasa, MD 30 mL/hr at 12/27/14 1328 300 mcg/hr at 12/27/14 1328  . free water 25 mL  25 mL Per Tube 6 times per day Erin FullingKurian Kasa, MD   25 mL at 12/27/14 1600  . heparin injection 1,000-6,000 Units  1,000-6,000 Units CRRT PRN Munsoor Lateef, MD      . heparin injection 5,000 Units  5,000 Units Subcutaneous 3 times per day Marguarite ArbourJeffrey D Sparks, MD   5,000 Units at 12/27/14 1429  . hydrALAZINE (APRESOLINE) injection 10 mg  10 mg Intravenous Q6H PRN Adrian SaranSital Mody, MD      . metoprolol (LOPRESSOR) injection 5 mg  5 mg Intravenous 4 times per day Marguarite ArbourJeffrey D Sparks, MD   5 mg at 12/27/14 1758  . midazolam (VERSED) injection 2 mg  2 mg Intravenous Q1H PRN Erin FullingKurian Kasa, MD   2 mg at 12/26/14 1531  . ondansetron (ZOFRAN) injection 4 mg  4 mg Intravenous Q6H PRN Marguarite ArbourJeffrey D Sparks, MD      . propofol (DIPRIVAN) 1000 MG/100ML infusion  5-80 mcg/kg/min Intravenous Titrated Erin FullingKurian Kasa, MD 5.6 mL/hr at 12/27/14 1327 8 mcg/kg/min at 12/27/14 1327  .  pureflow IV solution for Dialysis   CRRT Continuous Munsoor Lateef, MD      . sennosides (SENOKOT) 8.8 MG/5ML syrup 10 mL  10 mL Oral BID Erin FullingKurian Kasa, MD   10 mL at 12/27/14 1003  . sodium chloride 0.9 % 1,000 mL with thiamine 100 mg, folic acid 1 mg, multivitamins adult 10 mL infusion   Intravenous Continuous Adrian SaranSital Mody, MD 40 mL/hr at 12/27/14 1540    . sodium chloride 0.9 % injection 3 mL  3 mL Intravenous Q12H Marguarite ArbourJeffrey D Sparks, MD   3 mL at 12/27/14 1005    Past Medical History  Diagnosis Date  . Hypertension   . Hyperlipidemia   . Arthritis   . Gout     History reviewed. No pertinent past surgical history.  Social History History  Substance Use Topics  . Smoking status: Never Smoker   . Smokeless tobacco: Not on file  . Alcohol Use: 0.0 oz/week    0 Standard drinks or equivalent per week     Comment: GIN every day  heavy    Family History Family History  Problem Relation Age of Onset  . Family history unknown: Yes  unobtainable with patient intubated, none known on record  Allergies  Allergen Reactions  . Penicillins Other (See Comments)    Unknown reaction     REVIEW OF SYSTEMS (Negative unless checked)    Unable to obtain the review of systems due to the patient's severe systemic illness and altered mental status requiring intubation.       Physical Examination  Filed Vitals:   12/27/14 1400 12/27/14 1411 12/27/14 1500 12/27/14 1600  BP: 97/57  99/58 116/61  Pulse: 101  99 103  Temp:    99.3 F (37.4 C)  TempSrc:    Oral  Resp: 16  16 17   Height:      Weight:      SpO2: 100% 100% 99% 100%   Body mass index is 42.33 kg/(m^2).  Head: [place]/AT, No temporalis wasting Ear/Nose/Throat: Hearing grossly intact, nares w/o erythema or drainage,  Eyes: PERRLA, EOMI.  Neck: Supple, no nuchal rigidity.  No bruit or JVD.  Pulmonary:  Course, on the ventilator  Cardiac: RRR, normal S1, S2, no Murmurs, rubs or gallops.  Gastrointestinal: soft, non-tender/non-distended. No guarding/reflex.  Musculoskeletal:  Extremities without ischemic changes.  No deformity or atrophy. 2+ Edema in the lower extremities bilaterally Neurologic: sedated, unable to assess Psychiatric: Difficult to assess due to the severity of patient's illness. Dermatologic: No rashes or ulcers noted.   Lymph : No Cervical, Axillary, or Inguinal lymphadenopathy.    CBC Lab Results  Component Value Date   WBC 18.2* 12/27/2014   HGB 10.3* 12/27/2014   HCT 32.5* 12/27/2014   MCV 81.5 12/27/2014   PLT 261 12/27/2014    BMET    Component Value Date/Time   NA 134* 12/27/2014 0443   K 6.8* 12/27/2014 1447   CL 104 12/27/2014 0443   CO2 24 12/27/2014 0443   GLUCOSE 108* 12/27/2014 0443   BUN 12 12/27/2014 0443   CREATININE 1.61* 12/27/2014 0443   CALCIUM 8.1* 12/27/2014 0443   GFRNONAA 42* 12/27/2014 0443    GFRAA 49* 12/27/2014 0443   Estimated Creatinine Clearance: 52.6 mL/min (by C-G formula based on Cr of 1.61).  COAG Lab Results  Component Value Date   INR 1.09 12/23/2014       Assessment/Plan 1. ARF 2. Hyperkalemia 3.  AMS, sedated on the vent.  Work up  per primary service  We placed a temporary dialysis catheter without difficulty in the right groin.  Left IJ had a central line already and will save right jugular for permcath if he does not recover.  OK to use line.  If does not improve renal function, will be happy to place permcath prior to discharge   We will proceed with temporary dialysis catheter placement at this time.  Risks and benefits discussed with patient and/or family, and the catheter will be placed to allow immediate initiation of dialysis.  If the patient's renal function does not improve throughout the hospital course, we will be happy to place a tunneled dialysis catheter for long term use prior to discharge.     Taiwan Millon, MD  12/27/2014 6:00 PM    Level 3 consult

## 2014-12-27 NOTE — Progress Notes (Signed)
Nutrition Follow-up  DOCUMENTATION CODES:     INTERVENTION:  Coordination of care: Discussed in ICU rounds, recommend increasing vital high protein to 7240ml/hr at this time and if tolerating after 12 hr increase to goal rate of 4662ml/hr to meet nutritional needs.  NUTRITION DIAGNOSIS:  Inadequate oral intake related to inability to eat as evidenced by NPO status, being addressed with tube feeding.    GOAL:  Provide needs based on ASPEN/SCCM guidelines (EN: Goal for pt to tolerate intiation of TF and titration to goal within 24-48 hours)    MONITOR:   (EN, Electrolyte and Renal Profile, Digestive system, Pulmonary Profile)  REASON FOR ASSESSMENT:   (follow-up) Enteral/tube feeding initiation and management  ASSESSMENT:  Pt remains on vent going through DT's.  Pallative care following  Electrolyte and Renal Profile:    Recent Labs Lab 12/25/14 0448 12/26/14 0457 12/27/14 0443  BUN 8 10 12   CREATININE 1.17 1.27* 1.61*  NA 141 135 134*  K 4.7 4.7 6.5*  MG 1.7 1.5* 2.1  PHOS 3.1 2.4* 5.9*   Glucose Profile:  Recent Labs  12/26/14 1614 12/27/14 0753 12/27/14 1205  GLUCAP 100* 85 84   Medications: kaxeylate, NS with MVI at 6940ml/hr, diprivan at 5.636ml/hr to provide 147 kcals over next 24 hr  Height:  Ht Readings from Last 1 Encounters:  Oct 17, 2014 5\' 6"  (1.676 m)    Weight:  Wt Readings from Last 1 Encounters:  12/27/14 262 lb 2 oz (118.9 kg)    Ideal Body Weight:     Wt Readings from Last 10 Encounters:  12/27/14 262 lb 2 oz (118.9 kg)  12/23/14 270 lb (122.471 kg)    BMI:  Body mass index is 42.33 kg/(m^2).  Estimated Nutritional Needs:  Kcal:  1298-1652kcals, (11-14kcals/kg) using acutal body weight of 118kg  Protein:  129-161g protein (2.0-2.5g/kg) using IBW of 64.5kg  Fluid:  1613-194835mL of fluid (25-5930mL/kg) using IBW of 64.5kg  Skin:  Reviewed, no issues  Diet Order:  Diet NPO time specified  EDUCATION NEEDS:  No education needs  identified at this time   Intake/Output Summary (Last 24 hours) at 12/27/14 1347 Last data filed at 12/27/14 1100  Gross per 24 hour  Intake 5279.9 ml  Output   1758 ml  Net 3521.9 ml     HIGH Care Level Lumen Brinlee B. Freida BusmanAllen, RD, LDN [phone] (pager)

## 2014-12-27 NOTE — Consult Note (Signed)
[facility] CONSULT NOTE    Date: 12/27/2014                  Patient Name:  Lucas Ortega  MRN: [id number]  DOB: [birth year]  Age / Sex: 69 y.o., male         PCP: Rafael Bihari, MD                 Service Requesting Consult: PCCM/Dr. Mortimer Fries                 Reason for Consult: Hyperkalemia, acute renal failure            History of Present Illness: Patient is a 69 y.o. male with a PMHx of hypertension, hyperlipidemia, osteoarthritis, gout, who was admitted to [facility] on 12/27/2014 for evaluation of left facial droop, left sided weakness.  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  He is currently intubated and sedated and on the ventilator.  We are asked to see him for evaluation and management of acute renal failure with hyperkalemia.  Patient is noted to be on vital tube feeds.  Potassium earlier in the day was 6.5 but it has now risen to 6.8 despite aggressive medical management.  Patients baseline CR is 1.17 with egfr greater than 60.    Medications: Outpatient medications: Prescriptions prior to admission  Medication Sig Dispense Refill Last Dose  . amLODipine (NORVASC) 5 MG tablet Take 5 mg by mouth daily.   unknown  . colchicine 0.6 MG tablet Take 0.6 mg by mouth daily.   unknown  . lisinopril (PRINIVIL,ZESTRIL) 40 MG tablet Take 40 mg by mouth daily.   unknown  . Multiple Vitamins-Minerals (CENTRUM SILVER ADULT 50+) TABS Take 1 tablet by mouth daily.   unknown  . vitamin B-12 (CYANOCOBALAMIN) 1000 MCG tablet Take 1,000 mcg by mouth daily.   unknown    Current medications: Current Facility-Administered Medications  Medication Dose Route Frequency Provider Last Rate Last Dose  . acetaminophen (TYLENOL) tablet 650 mg  650 mg Oral Q6H PRN Idelle Crouch, MD   650 mg at 12/26/14 0434   Or  . acetaminophen (TYLENOL) suppository 650 mg  650 mg Rectal Q6H PRN Idelle Crouch, MD      . albuterol (PROVENTIL) (2.5 MG/3ML) 0.083% nebulizer solution  2.5 mg  2.5 mg Nebulization Q6H Idelle Crouch, MD   2.5 mg at 12/27/14 1411  . aspirin chewable tablet 81 mg  81 mg Oral Daily Sital Mody, MD   81 mg at 12/27/14 1004  . atorvastatin (LIPITOR) tablet 80 mg  80 mg Oral q1800 Flora Lipps, MD   80 mg at 12/26/14 1755  . chlorhexidine (PERIDEX) 0.12 % solution 15 mL  15 mL Mouth/Throat BID Flora Lipps, MD   15 mL at 12/27/14 0806  . clindamycin (CLEOCIN) IVPB 600 mg  600 mg Intravenous 3 times per day Flora Lipps, MD   600 mg at 12/27/14 1055  . docusate (COLACE) 50 MG/5ML liquid 100 mg  100 mg Oral BID Vaughan Basta, MD   100 mg at 12/27/14 1056  . famotidine (PEPCID) IVPB 20 mg premix  20 mg Intravenous Q12H Flora Lipps, MD   20 mg at 12/27/14 1002  . feeding supplement (VITAL HIGH PROTEIN) liquid 1,000 mL  1,000 mL Per Tube Continuous Flora Lipps, MD 40 mL/hr at 12/27/14 1540 1,000 mL at 12/27/14 1540  . fentaNYL 2556mg in NS 2543m(1068mml) infusion-PREMIX  10 mcg/hr Intravenous  Continuous Flora Lipps, MD 30 mL/hr at 12/27/14 1328 300 mcg/hr at 12/27/14 1328  . free water 25 mL  25 mL Per Tube 6 times per day Flora Lipps, MD   25 mL at 12/27/14 1600  . heparin injection 5,000 Units  5,000 Units Subcutaneous 3 times per day Idelle Crouch, MD   5,000 Units at 12/27/14 1429  . hydrALAZINE (APRESOLINE) injection 10 mg  10 mg Intravenous Q6H PRN Bettey Costa, MD      . metoprolol (LOPRESSOR) injection 5 mg  5 mg Intravenous 4 times per day Idelle Crouch, MD   5 mg at 12/27/14 1206  . midazolam (VERSED) injection 2 mg  2 mg Intravenous Q1H PRN Flora Lipps, MD   2 mg at 12/26/14 1531  . ondansetron (ZOFRAN) injection 4 mg  4 mg Intravenous Q6H PRN Idelle Crouch, MD      . propofol (DIPRIVAN) 1000 MG/100ML infusion  5-80 mcg/kg/min Intravenous Titrated Flora Lipps, MD 5.6 mL/hr at 12/27/14 1327 8 mcg/kg/min at 12/27/14 1327  . sennosides (SENOKOT) 8.8 MG/5ML syrup 10 mL  10 mL Oral BID Flora Lipps, MD   10 mL at 12/27/14 1003  . sodium  chloride 0.9 % 1,000 mL with thiamine 026 mg, folic acid 1 mg, multivitamins adult 10 mL infusion   Intravenous Continuous Bettey Costa, MD 40 mL/hr at 12/27/14 1540    . sodium chloride 0.9 % injection 3 mL  3 mL Intravenous Q12H Idelle Crouch, MD   3 mL at 12/27/14 1005      Allergies: Allergies  Allergen Reactions  . Penicillins Other (See Comments)    Unknown reaction      Past Medical History: Past Medical History  Diagnosis Date  . Hypertension   . Hyperlipidemia   . Arthritis   . Gout      Past Surgical History: History reviewed. No pertinent past surgical history.   Family History: Unable to obtain, patient on ventilator.  Social History: History   Social History  . Marital Status: Single    Spouse Name: N/A  . Number of Children: 1  . Years of Education: N/A   Occupational History  . unemployed    Social History Main Topics  . Smoking status: Never Smoker   . Smokeless tobacco: Not on file  . Alcohol Use: 0.0 oz/week    0 Standard drinks or equivalent per week     Comment: GIN every day heavy  . Drug Use: No  . Sexual Activity: Not on file   Other Topics Concern  . Not on file   Social History Narrative   Lives with his sister   He has 1 daughter     Review of Systems: Review of Systems  Unable to perform ROS: critical illness    Vital Signs: Blood pressure 116/61, pulse 103, temperature 99.3 F (37.4 C), temperature source Oral, resp. rate 17, height _0  (1.676 m), weight 118.9 kg (262 lb 2 oz), SpO2 100 %.  Weight trends: Filed Weights   12/26/14 0506 12/27/14 0100 12/27/14 0500  Weight: 117.7 kg (259 lb 7.7 oz) 118.9 kg (262 lb 2 oz) 118.9 kg (262 lb 2 oz)    Physical Exam: General: Critically ill appearing  Head: Normocephalic, atraumatic, ETT tube in place  Eyes: Anicteric, no spontaneous EOM, pupils sluggish to react bilateral.  Nose: Mucous membranes moist, not inflammed  Throat: ETT in place  Neck: Supple, trachea  midline.  Lungs:  Bilateral rhonchi, vent  assisted.  Heart: S1S2 tachycardic  Abdomen:  BS normoactive. Soft, distended.  Extremities: Trace b/l LE edema  Neurologic: Intubated sedated not following commands, gag present  Skin: No visible rashes    Lab results: Basic Metabolic Panel:  Recent Labs Lab 12/23/14 1403  12/24/14 0710 12/25/14 0448 12/26/14 0457 12/27/14 0443 12/27/14 1447  NA  --   --  139 141 135 134*  --   K 3.0*  < > 4.2 4.7 4.7 6.5* 6.8*  CL  --   --  104 107 102 104  --   CO2  --   --  _0 --   GLUCOSE  --   --  127* 95 116* 108*  --   BUN  --   --  <5* _1 --   CREATININE  --   --  1.09 1.17 1.27* 1.61*  --   CALCIUM  --   --  8.0* 8.4* 8.3* 8.1*  --   MG 2.1  --  2.0 1.7 1.5* 2.1  --   PHOS  --   --  2.6 3.1 2.4* 5.9*  --   < > = values in this interval not displayed.  Liver Function Tests:  Recent Labs Lab 12/27/2014 1444 12/23/14 0408  AST 128* 107*  ALT 32 29  ALKPHOS 80 67  BILITOT 1.7* 1.4*  PROT 6.9 6.5  ALBUMIN 4.0 3.6   No results for input(s): LIPASE, AMYLASE in the last 168 hours. No results for input(s): AMMONIA in the last 168 hours.  CBC:  Recent Labs Lab 12/30/2014 1444 12/23/14 0408 12/27/14 0443  WBC 27.6* 14.3* 18.2*  NEUTROABS 23.5*  --   --   HGB 11.8* 10.9* 10.3*  HCT 37.8* 34.0* 32.5*  MCV 82.1 80.0 81.5  PLT 283 267 261    Cardiac Enzymes:  Recent Labs Lab 12/15/2014 1444  TROPONINI 0.04*    BNP: Invalid input(s): POCBNP  CBG:  Recent Labs Lab 12/26/14 0716 12/26/14 1614 12/27/14 0753 12/27/14 1205 12/27/14 1602  GLUCAP 129* 100* 85 61 104*    Microbiology: Results for orders placed or performed during the hospital encounter of 01/07/2015  Blood culture (routine x 2)     Status: None   Collection Time: 12/11/2014  2:44 PM  Result Value Ref Range Status   Specimen Description BLOOD  Final   Special Requests BLOOD  Final   Culture NO GROWTH 5 DAYS  Final   Report Status 12/27/2014  FINAL  Final  Blood culture (routine x 2)     Status: None   Collection Time: 12/23/2014  3:02 PM  Result Value Ref Range Status   Specimen Description BLOOD  Final   Special Requests BLOOD  Final   Culture NO GROWTH 5 DAYS  Final   Report Status 12/27/2014 FINAL  Final    Coagulation Studies: No results for input(s): LABPROT, INR in the last 72 hours.  Urinalysis: No results for input(s): COLORURINE, LABSPEC, PHURINE, GLUCOSEU, HGBUR, BILIRUBINUR, KETONESUR, PROTEINUR, UROBILINOGEN, NITRITE, LEUKOCYTESUR in the last 72 hours.  Invalid input(s): APPERANCEUR    Imaging: Dg Chest Port 1 View  12/27/2014   CLINICAL DATA:  Chest congestion.  EXAM: PORTABLE CHEST - 1 VIEW  COMPARISON:  Dec 25, 2014.  FINDINGS: Severe hypoinflation of the lungs is noted. No pneumothorax is noted. Endotracheal tube is noted with distal tip approximately 1 cm above the carina which is slightly improved compared to prior exam. Stable left  internal jugular catheter is noted with distal tip overlying expected position of the SVC. Nasogastric tube is seen in the esophagus, although this distal tip is not well visualized. Mild bibasilar opacities are noted most consistent with subsegmental atelectasis. Bony thorax is intact.  IMPRESSION: Severe hypoinflation of the lungs is noted. Mild bibasilar opacities are noted most consistent with subsegmental atelectasis.   Electronically Signed   By: Marijo Conception, M.D.   On: 12/27/2014 08:23      Assessment & Plan: Pt is a 69 y.o. yo male with a PMHX of hypertension, hyperlipidemia, osteoarthritis, gout was admitted to [facility] on 12/24/2014 with right midbrain CVA.    1. Acute renal failure secondary to acute tubular necrosis. The patient has had some periods of relative hypotension over the past 24 hours. Map has been as low as 68. Patient has also developed hyperkalemia. Therefore we are favoring continuous renal replacement therapy even though his creatinine is not that high  at present. We will consult with vascular surgery to place temporary dialysis catheter. In addition I have recommended to the CCU nursing staff to switch his tube feeds to Nepro. We will also obtain a renal ultrasound for further evaluation.  2. Hyperkalemia. Serum potassium noted to be elevated at 6.8 despite aggressive medical therapy. Given rising potassium we will proceed with continuous renal replacement therapy as above. We will use a 2 potassium bath. Follow serum potassium closely.  3. Acute respiratory failure. Secondary to his acute CVA. Patient may not be able to protect his airway. Continue mechanical ventilation.  4. Hypotension. Would recommend continued monitoring of his mean arterial pressure. May need to hold metoprolol if blood pressure continues to be low.  5. Thanks for consult.  _0 @

## 2014-12-28 ENCOUNTER — Inpatient Hospital Stay: Payer: Medicare Other

## 2014-12-28 LAB — RENAL FUNCTION PANEL
ALBUMIN: 2.2 g/dL — AB (ref 3.5–5.0)
ALBUMIN: 2.3 g/dL — AB (ref 3.5–5.0)
ANION GAP: 6 (ref 5–15)
Albumin: 2.3 g/dL — ABNORMAL LOW (ref 3.5–5.0)
Anion gap: 6 (ref 5–15)
Anion gap: 7 (ref 5–15)
BUN: 18 mg/dL (ref 6–20)
BUN: 18 mg/dL (ref 6–20)
BUN: 17 mg/dL (ref 6–20)
CALCIUM: 8 mg/dL — AB (ref 8.9–10.3)
CHLORIDE: 106 mmol/L (ref 101–111)
CO2: 24 mmol/L (ref 22–32)
CO2: 24 mmol/L (ref 22–32)
CO2: 26 mmol/L (ref 22–32)
CREATININE: 2.03 mg/dL — AB (ref 0.61–1.24)
CREATININE: 2.07 mg/dL — AB (ref 0.61–1.24)
CREATININE: 1.86 mg/dL — AB (ref 0.61–1.24)
Calcium: 8 mg/dL — ABNORMAL LOW (ref 8.9–10.3)
Calcium: 8.3 mg/dL — ABNORMAL LOW (ref 8.9–10.3)
Chloride: 106 mmol/L (ref 101–111)
Chloride: 106 mmol/L (ref 101–111)
GFR calc Af Amer: 37 mL/min — ABNORMAL LOW (ref >60)
GFR calc Af Amer: 36 mL/min — ABNORMAL LOW (ref >60)
GFR calc non Af Amer: 31 mL/min — ABNORMAL LOW (ref >60)
GFR calc non Af Amer: 35 mL/min — ABNORMAL LOW (ref >60)
GFR, EST AFRICAN AMERICAN: 41 mL/min — AB (ref >60)
GFR, EST NON AFRICAN AMERICAN: 32 mL/min — AB (ref >60)
GLUCOSE: 119 mg/dL — AB (ref 65–99)
GLUCOSE: 142 mg/dL — AB (ref 65–99)
Glucose, Bld: 125 mg/dL — ABNORMAL HIGH (ref 65–99)
POTASSIUM: 5.9 mmol/L — AB (ref 3.5–5.1)
POTASSIUM: 5.7 mmol/L — AB (ref 3.5–5.1)
Phosphorus: 6.2 mg/dL — ABNORMAL HIGH (ref 2.5–4.6)
Phosphorus: 6.5 mg/dL — ABNORMAL HIGH (ref 2.5–4.6)
Phosphorus: 6.1 mg/dL — ABNORMAL HIGH (ref 2.5–4.6)
Potassium: 5.1 mmol/L (ref 3.5–5.1)
SODIUM: 136 mmol/L (ref 135–145)
Sodium: 136 mmol/L (ref 135–145)
Sodium: 139 mmol/L (ref 135–145)

## 2014-12-28 LAB — GLUCOSE, CAPILLARY
GLUCOSE-CAPILLARY: 125 mg/dL — AB (ref 65–99)
Glucose-Capillary: 112 mg/dL — ABNORMAL HIGH (ref 65–99)
Glucose-Capillary: 112 mg/dL — ABNORMAL HIGH (ref 65–99)
Glucose-Capillary: 120 mg/dL — ABNORMAL HIGH (ref 65–99)
Glucose-Capillary: 97 mg/dL (ref 65–99)

## 2014-12-28 LAB — MAGNESIUM
Magnesium: 2.1 mg/dL (ref 1.7–2.4)
Magnesium: 2.1 mg/dL (ref 1.7–2.4)

## 2014-12-28 LAB — CBC
HCT: 28.2 % — ABNORMAL LOW (ref 40.0–52.0)
Hemoglobin: 8.8 g/dL — ABNORMAL LOW (ref 13.0–18.0)
MCH: 25.8 pg — ABNORMAL LOW (ref 26.0–34.0)
MCHC: 31.1 g/dL — ABNORMAL LOW (ref 32.0–36.0)
MCV: 82.8 fL (ref 80.0–100.0)
Platelets: 174 10*3/uL (ref 150–440)
RBC: 3.4 MIL/uL — AB (ref 4.40–5.90)
RDW: 19.1 % — ABNORMAL HIGH (ref 11.5–14.5)
WBC: 13.2 10*3/uL — ABNORMAL HIGH (ref 3.8–10.6)

## 2014-12-28 LAB — TRIGLYCERIDES: Triglycerides: 107 mg/dL (ref <150)

## 2014-12-28 LAB — APTT: aPTT: 36 seconds (ref 24–36)

## 2014-12-28 MED ORDER — ALTEPLASE 2 MG IJ SOLR
2.0000 mg | INTRAMUSCULAR | Status: AC
  Administered 2014-12-28: 2 mg
  Filled 2014-12-28: qty 2

## 2014-12-28 MED ORDER — SODIUM POLYSTYRENE SULFONATE 15 GM/60ML PO SUSP
15.0000 g | Freq: Once | ORAL | Status: AC
  Administered 2014-12-28: 15 g
  Filled 2014-12-28: qty 60

## 2014-12-28 NOTE — Progress Notes (Signed)
Dr. Cherylann RatelLateef notified of CRRT clotting again.  Activase to be given again

## 2014-12-28 NOTE — Progress Notes (Signed)
Nutrition Follow-up  DOCUMENTATION CODES:     INTERVENTION:   (EN: Recommend continuing nepro at 5335ml/hr with current diprivan (providing 187 kcals). Feeding meeting 100% kcals needs and 53% protein needs.  Will assess addition of prostat pending diprivan rate changes)  NUTRITION DIAGNOSIS:  Inadequate oral intake related to inability to eat as evidenced by NPO status, ongoing.    GOAL:   (Goal would be for pt to tolerate tube feeding at goal rate)    MONITOR:   (EN, Electrolyte and Renal Profile, Digestive system, Pulmonary Profile)  REASON FOR ASSESSMENT:   (follow-up) Enteral/tube feeding initiation and management  ASSESSMENT:  Pt remains on vent, CRRT stopped, blood cultures positive  Electrolyte and Renal Profile:    Recent Labs Lab 12/27/14 0443  12/27/14 1805 12/28/14 0203 12/28/14 0517  BUN 12  --  17 18 18   CREATININE 1.61*  --  2.12* 2.03* 2.07*  NA 134*  --  135 136 136  K 6.5*  < > 6.0* 5.9* 5.7*  MG 2.1  --  2.2 2.1  --   PHOS 5.9*  --  6.6* 6.2* 6.5*  < > = values in this interval not displayed.  Medications: kaxeylate given, diprivan 7.341ml/hr (providing 187 kcals over next 24 hr), NS with MVI at 3740ml/hr, fentnyl  Height:  Ht Readings from Last 1 Encounters:  10/24/2014 5\' 6"  (1.676 m)    Weight:  Wt Readings from Last 1 Encounters:  12/28/14 279 lb 8.7 oz (126.8 kg)    Ideal Body Weight:     Wt Readings from Last 10 Encounters:  12/28/14 279 lb 8.7 oz (126.8 kg)  12/23/14 270 lb (122.471 kg)    BMI:  Body mass index is 45.14 kg/(m^2).  Estimated Nutritional Needs:  Kcal:  1298-1652kcals, (11-14kcals/kg) using acutal body weight of 118kg  Protein:  129-161g protein (2.0-2.5g/kg) using IBW of 64.5kg  Fluid:  1613-195635mL of fluid (25-7030mL/kg) using IBW of 64.5kg  Skin:  Reviewed, no issues  Diet Order:  Diet NPO time specified  EDUCATION NEEDS:  No education needs identified at this time   Intake/Output Summary  (Last 24 hours) at 12/28/14 1108 Last data filed at 12/28/14 1100  Gross per 24 hour  Intake 2636.65 ml  Output   2100 ml  Net 536.65 ml    Last BM:  5/21, + flatus, noted active bowel sounds, distended obese abdomen per documentation  HIGH Care Level Althia Egolf B. Freida BusmanAllen, RD, LDN [phone] (pager)

## 2014-12-28 NOTE — Progress Notes (Signed)
Pulmonary Critical Care  Initial Consult Note   Lucas Ortega ZOX:[id number] DOB: 03-Jul-1946 DOA: 12/20/2014  Referring physician: Aram Beecham, MD PCP: Ashley Mariner, MD   Chief Complaint: Acute respiratory failure with hypoxia  HPI: Lucas Ortega is a 69 y.o. male with respiratory failure. Remains on the vent at this time is not verbal. Patient had sedation decreased this am but did not tolerate with increased WOB noted. Sedation now restarted.   Review of Systems:  Patient is note able to provide a ROS  Past Medical History  Diagnosis Date  . Hypertension   . Hyperlipidemia   . Arthritis   . Gout    History reviewed. No pertinent past surgical history. Social History:  reports that he has never smoked. He does not have any smokeless tobacco history on file. He reports that he drinks alcohol. He reports that he does not use illicit drugs.  Allergies  Allergen Reactions  . Penicillins Other (See Comments)    Unknown reaction    Family History  Problem Relation Age of Onset  . Family history unknown: Yes    Prior to Admission medications   Medication Sig Start Date End Date Taking? Authorizing Provider  amLODipine (NORVASC) 5 MG tablet Take 5 mg by mouth daily.   Yes Historical Provider, MD  colchicine 0.6 MG tablet Take 0.6 mg by mouth daily.   Yes Historical Provider, MD  lisinopril (PRINIVIL,ZESTRIL) 40 MG tablet Take 40 mg by mouth daily.   Yes Historical Provider, MD  Multiple Vitamins-Minerals (CENTRUM SILVER ADULT 50+) TABS Take 1 tablet by mouth daily.   Yes Historical Provider, MD  vitamin B-12 (CYANOCOBALAMIN) 1000 MCG tablet Take 1,000 mcg by mouth daily.   Yes Historical Provider, MD   Physical Exam: Filed Vitals:   12/28/14 0800 12/28/14 0900 12/28/14 1000 12/28/14 1100  BP: 111/69 131/67 156/66 146/63  Pulse: 81 83 110 100  Temp:      TempSrc:      Resp: Height:      Weight:      SpO2: 96% 98% 88% 95%    Wt Readings from  Last 3 Encounters:  12/28/14 126.8 kg (279 lb 8.7 oz)  12/23/14 122.471 kg (270 lb)    General:  Appears calm sedated Eyes: PERRL, normal lids ENT: Orally intubated Neck: no LAD, massesy Cardiovascular: RRR, no m/r/g. No LE edema. Respiratory: CTA bilaterally, no w/r/r. Normal respiratory effort. Abdomen: soft, nontender Skin: no rash or induration seen on limited exam Psychiatric: sedated Neurologic: flaccid.          Labs on Admission:  Basic Metabolic Panel:  Recent Labs Lab 12/26/14 0457 12/27/14 0443 12/27/14 1447 12/27/14 1805 12/28/14 0203 12/28/14 0517 12/28/14 1039  NA 135 134*  --  135 136 136  --   K 4.7 6.5* 6.8* 6.0* 5.9* 5.7*  --   CL 102 104  --  105 106 106  --   CO2 24 24  --  --   GLUCOSE 116* 108*  --  104* 119* 125*  --   BUN 10 12  --  --   CREATININE 1.27* 1.61*  --  2.12* 2.03* 2.07*  --   CALCIUM 8.3* 8.1*  --  8.0* 8.0* 8.0*  --   MG 1.5* 2.1  --  2.2 2.1  --  2.1  PHOS 2.4* 5.9*  --  6.6* 6.2* 6.5*  --  Liver Function Tests:  Recent Labs Lab 01/07/2015 1444 12/23/14 0408 12/27/14 1805 12/28/14 0203 12/28/14 0517  AST 128* 107*  --   --   --   ALT 32 29  --   --   --   ALKPHOS 80 67  --   --   --   BILITOT 1.7* 1.4*  --   --   --   PROT 6.9 6.5  --   --   --   ALBUMIN 4.0 3.6 2.2* 2.2* 2.3*   No results for input(s): LIPASE, AMYLASE in the last 168 hours. No results for input(s): AMMONIA in the last 168 hours. CBC:  Recent Labs Lab 01/01/2015 1444 12/23/14 0408 12/27/14 0443 12/28/14 0517  WBC 27.6* 14.3* 18.2* 13.2*  NEUTROABS 23.5*  --   --   --   HGB 11.8* 10.9* 10.3* 8.8*  HCT 37.8* 34.0* 32.5* 28.2*  MCV 82.1 80.0 81.5 82.8  PLT 283 267 261 174   Cardiac Enzymes:  Recent Labs Lab 12/31/2014 1444  TROPONINI 0.04*    BNP (last 3 results) No results for input(s): BNP in the last 8760 hours.  ProBNP (last 3 results) No results for input(s): PROBNP in the last 8760 hours.  CBG:  Recent  Labs Lab 12/27/14 1652 12/27/14 1717 12/27/14 2001 12/28/14 0027 12/28/14 0415  GLUCAP 57* 135* 115* 112* 97    Radiological Exams on Admission: [facility] Renal  12/28/2014   CLINICAL DATA:  69 year old male with acute renal failure and tubular necrosis  EXAM: RENAL / URINARY TRACT ULTRASOUND COMPLETE  COMPARISON:  None.  FINDINGS: Right Kidney:  Length: 9.7 cm. No evidence of hydronephrosis. Slightly increased echogenicity of the renal parenchyma. No solid mass or lesion.  Left Kidney:  Length: 10 cm. Limited visualization secondary to patient body habitus. No hydronephrosis. Echogenicity difficult to assess given poor visibility.  Bladder:  Collapsed bladder with Foley catheter in place.  IMPRESSION: 1. No evidence of hydronephrosis. 2. Slightly increased echogenicity of the right renal parenchyma suggests underlying medical renal disease. 3. Poor visualization of the left kidney. 4. Collapsed bladder with Foley catheter in place.   Electronically Signed   By: Malachy MoanHeath  McCullough M.D.   On: 12/28/2014 10:09   Dg Chest Port 1 View  12/27/2014   CLINICAL DATA:  Chest congestion.  EXAM: PORTABLE CHEST - 1 VIEW  COMPARISON:  Dec 25, 2014.  FINDINGS: Severe hypoinflation of the lungs is noted. No pneumothorax is noted. Endotracheal tube is noted with distal tip approximately 1 cm above the carina which is slightly improved compared to prior exam. Stable left internal jugular catheter is noted with distal tip overlying expected position of the SVC. Nasogastric tube is seen in the esophagus, although this distal tip is not well visualized. Mild bibasilar opacities are noted most consistent with subsegmental atelectasis. Bony thorax is intact.  IMPRESSION: Severe hypoinflation of the lungs is noted. Mild bibasilar opacities are noted most consistent with subsegmental atelectasis.   Electronically Signed   By: Lupita RaiderJames  Green Jr, M.D.   On: 12/27/2014 08:23   Dg Abd Portable 1v  12/28/2014   CLINICAL DATA:   Orogastric tube placement at bedside.  EXAM: PORTABLE ABDOMEN - 1 VIEW  COMPARISON:  Portable abdomen x-ray 12/25/2014.  FINDINGS: OG tube tip within the gastric antrum. Right femoral central venous catheter tip projects over the expected location of the right common iliac vein. Visualized bowel gas pattern unremarkable.  Note made of atelectasis in the visualized lung bases.  IMPRESSION: 1. OG tube tip within the gastric antrum. 2. Atelectasis involving the visualized lung bases. 3. No acute abdominal abnormality.   Electronically Signed   By: Hulan Saas M.D.   On: 12/28/2014 08:32    EKG: Independently reviewed.  Assessment/Plan Active Problems:   CVA (cerebral infarction)   Seizure disorder   CVA (cerebral vascular accident)   Aspiration into airway   1. Acute Respiratory failure with hypoxia -will continue with full vent support on PRVC mode presently -will titrate FiO2 as tolerated -will continue with bronchodilators  2. Acute CVA -neurology is following  3. Seizures -currently seizure free  4. Aspiration Pneumonia -on empiric antibiotics  Code Status: TEDS  DVT Prophylaxis:heaprin Family Communication: none Disposition Plan: Home Time spent:    I have personally obtained a history, examined the patient, evaluated laboratory and imaging results, formulated the assessment and plan and placed orders.  The Patient requires high complexity decision making for assessment and support.    Yevonne Pax, MD [facility] Pulmonary Critical Care Medicine Sleep Medicine

## 2014-12-28 NOTE — Progress Notes (Signed)
CRRT restarted

## 2014-12-28 NOTE — Progress Notes (Signed)
CRRT clotted

## 2014-12-28 NOTE — Progress Notes (Signed)
CRRT clotted 

## 2014-12-28 NOTE — Progress Notes (Signed)
[facility] - [place] at [facility]   PATIENT NAME: Lucas Ortega    MR#:  [id number]  DATE OF BIRTH:  Jul 02, 1946  SUBJECTIVE:  he had thick secretions and respi distress, so intubated for airway protection on 12/25/14  REVIEW OF SYSTEMS:   Intubated and sedated on vent support. Was on CRRT - stopped today morning on 30% FIO2 on vent.  ROS DRUG ALLERGIES:   Allergies  Allergen Reactions  . Penicillins Other (See Comments)    Unknown reaction    VITALS:  Blood pressure 130/66, pulse 91, temperature 97 F (36.1 C), temperature source Axillary, resp. rate 15, height  (1.676 m), weight 126.8 kg (279 lb 8.7 oz), SpO2 99 %.  PHYSICAL EXAMINATION:   Physical Exam  Constitutional: No distress.  HENT:  Head: Normocephalic and atraumatic.  Neck: Normal range of motion. Neck supple. No JVD present.  Cardiovascular: Normal rate, regular rhythm and normal heart sounds.   No murmur heard. Pulmonary/Chest: Effort normal. No respiratory distress (think secretions from nose and mouth). He has no wheezes.  ET tube in place, vent support.  Abdominal: Bowel sounds are normal. He exhibits no distension and no mass. There is no tenderness.  Neurological:  Sedated on vent support.  Skin: Skin is warm. No rash noted. He is not diaphoretic.    LABORATORY PANEL:   CBC  Recent Labs Lab 12/28/14 0517  WBC 13.2*  HGB 8.8*  HCT 28.2*  PLT 174   ------------------------------------------------------------------------------------------------------------------  Chemistries   Recent Labs Lab 12/23/14 0408  12/28/14 1039  NA 135  < > 139  K 2.6*  < > 5.1  CL 94*  < > 106  CO2 31  < > 26  GLUCOSE 101*  < > 142*  BUN <5*  < > 17  CREATININE 1.09  < > 1.86*  CALCIUM 8.1*  < > 8.3*  MG 1.8  < > 2.1  AST 107*  --   --   ALT 29  --   --   ALKPHOS 67  --   --   BILITOT 1.4*  --   --   < > = values in this interval not  displayed. ------------------------------------------------------------------------------------------------------------------  Cardiac Enzymes  Recent Labs Lab 28-Dec-2014 1444  TROPONINI 0.04*   ------------------------------------------------------------------------------------------------------------------  RADIOLOGY:  Dg Chest 2 View  12/28/14   CLINICAL DATA:  Possible aspiration.  Abnormal lung sounds.  EXAM: CHEST  2 VIEW  COMPARISON:  None.  FINDINGS: The heart size and mediastinal contours are within normal limits. Both lungs are clear. No pneumothorax or pleural effusion is noted. The visualized skeletal structures are unremarkable.  IMPRESSION: No active cardiopulmonary disease.   Electronically Signed   By: Lupita Raider, M.D.   On: 12/28/14 15:02   Ct Head (brain) Wo Contrast  12/28/14   IMPRESSION: Minimal diffuse cortical atrophy. Mild chronic ischemic white matter disease. No acute intracranial abnormality seen. These results were called by telephone at the time of interpretation on 28-Dec-2014 at 2:42 pm to Dr. Lenard Lance, who verbally acknowledged these results.   Electronically Signed   By: Lupita Raider, M.D.   On: 12-28-2014 14:44   Portable Chest 1 View  12/23/2014   IMPRESSION: Cardiomegaly  Calcified tortuous aorta  No obvious infiltrate, congestive heart failure or pneumothorax.  Evaluation limited by portable technique and patient's habitus.   Electronically Signed   By: Lacy Duverney M.D.   On: 12/23/2014 07:31     ASSESSMENT  AND PLAN:    This is 69 year old male with a history of hypertension and gout he was found by his family with left facial droop and left-sided weakness. He also has been drinking EtOH heavily. He had a seizure in the ER. * Aspiration pneumonia   Had fever and rise in WBcs    Start clindamycin, need sputum cx.   Pulmonary on case. On vent support.  * Stroke:  Patient has been seen and evaluated by neurology. Patient has a right  pontine infarct.   has swallowing issues. The etiology is likely small vessel disease from uncontrolled diabetes and hypertension. Intubated now, continue with aspirin and statin.  * Seizure: patient had a witnessed seizure in the emergency department. I suspect this is related to alcohol drinking.   Neurology recommends continuing Keppra 500 mg for 3 days then stop.  * hyperkalemia   Also haveworsening renal function.   Given kayexalate- no improvement, so started on CRRT per nephro on 12/27/14- stopped today to check improvement.  *  EtOH dependence: Patient is on CIWA protocol. On sedation now.  * Electrolyte abnormalities: pharmacy for assistance with electrolyte protocol.  * Essential hypertension: Patient is on lisinopril and Norvasc at home,   Here on hydralazine when necessary.  * Acute Respiratory failure :    Due to secretions - intubated for airway protection.   Pulmonary to manage vent support,.now have pneumonia.   Palliative care had a meeting with family, they would like everything to be done now.   He may need trach and PEG.  Meeting with palliative care again on Monday.  Management plans discussed with the patient's sister who is in agreement.  CODE STATUS FULL  TOTAL TIME TAKING CARE OF THIS PATIENT: 40 min critical care.    Altamese DillingVACHHANI, Shelitha Magley M.D on 12/28/2014 at 3:12 PM  Between 7am to 6pm - Pager - [phone] After 6pm go to [email or web address] - password [email or web address] [facility]  [facility]  Office  [phone]  CC: Primary care physician; Ashley MarinerGONZALEZ, CHRISTINA MARIE, MD

## 2014-12-28 NOTE — Progress Notes (Signed)
[facility]  ROUNDING NOTE   Subjective:   Placed on CRRT for hyperkalemia UOP 1850 UF 0  Blood cultures with gram positive cocci Right femoral HD catheter 5/20  Objective:  Vital signs in last 24 hours:  Temp:  [96.8 F (36 C)-100.3 F (37.9 C)] 96.8 F (36 C) (05/21 0720) Pulse Rate:  [72-116] 81 (05/21 0800) Resp:  [16-17] 16 (05/21 0800) BP: (91-116)/(52-88) 111/69 mmHg (05/21 0800) SpO2:  [96 %-100 %] 96 % (05/21 0800) FiO2 (%):  [30 %-70 %] 30 % (05/21 0814) Weight:  [126.8 kg (279 lb 8.7 oz)] 126.8 kg (279 lb 8.7 oz) (05/21 0500)  Weight change: 7.9 kg (17 lb 6.7 oz) Filed Weights   12/27/14 0100 12/27/14 0500 12/28/14 0500  Weight: 118.9 kg (262 lb 2 oz) 118.9 kg (262 lb 2 oz) 126.8 kg (279 lb 8.7 oz)    Intake/Output: I/O last 3 completed shifts: In: 7796.6 [I.V.:5782.8; NG/GT:1513.8; IV Piggyback:500] Out: 2980 [Urine:2980]   Intake/Output this shift:  Total I/O In: 25 [NG/GT:25] Out: 0   Physical Exam: General: Critically ill, intubated, sedated  Head: Eyes closed, ETT, OGT  Eyes: Eyes closed  Neck: Trachea midline  Lungs:  Clear, vent: PRVC FiO2 30%  Heart: Regular rate and rhythm  Abdomen:  +distended, +bowel sounds  Extremities: trace peripheral edema.  Neurologic: Intubated, sedated  Skin: No lesions  Access: Right femoral temp HD catheter. Dr. Wyn Quakerew 5/20    Basic Metabolic Panel:  Recent Labs Lab 12/25/14 0448 12/26/14 0457 12/27/14 0443 12/27/14 1447 12/27/14 1805 12/28/14 0203 12/28/14 0517  NA 141 135 134*  --  135 136 136  K 4.7 4.7 6.5* 6.8* 6.0* 5.9* 5.7*  CL 107 102 104  --  105 106 106  CO2 26 24 24   --  24 24 24   GLUCOSE 95 116* 108*  --  104* 119* 125*  BUN 8 10 12   --  17 18 18   CREATININE 1.17 1.27* 1.61*  --  2.12* 2.03* 2.07*  CALCIUM 8.4* 8.3* 8.1*  --  8.0* 8.0* 8.0*  MG 1.7 1.5* 2.1  --  2.2 2.1  --   PHOS 3.1 2.4* 5.9*  --  6.6* 6.2* 6.5*    Liver Function Tests:  Recent Labs Lab  12/10/2014 1444 12/23/14 0408 12/27/14 1805 12/28/14 0203 12/28/14 0517  AST 128* 107*  --   --   --   ALT 32 29  --   --   --   ALKPHOS 80 67  --   --   --   BILITOT 1.7* 1.4*  --   --   --   PROT 6.9 6.5  --   --   --   ALBUMIN 4.0 3.6 2.2* 2.2* 2.3*   No results for input(s): LIPASE, AMYLASE in the last 168 hours. No results for input(s): AMMONIA in the last 168 hours.  CBC:  Recent Labs Lab 01/05/2015 1444 12/23/14 0408 12/27/14 0443 12/28/14 0517  WBC 27.6* 14.3* 18.2* 13.2*  NEUTROABS 23.5*  --   --   --   HGB 11.8* 10.9* 10.3* 8.8*  HCT 37.8* 34.0* 32.5* 28.2*  MCV 82.1 80.0 81.5 82.8  PLT 283 267 261 174    Cardiac Enzymes:  Recent Labs Lab 12/19/2014 1444  TROPONINI 0.04*    BNP: Invalid input(s): POCBNP  CBG:  Recent Labs Lab 12/27/14 1652 12/27/14 1717 12/27/14 2001 12/28/14 0027 12/28/14 0415  GLUCAP 57* 135* 115* 112* 97  Microbiology: Results for orders placed or performed during the hospital encounter of Dec 29, 2014  Blood culture (routine x 2)     Status: None   Collection Time: 2014-12-29  2:44 PM  Result Value Ref Range Status   Specimen Description BLOOD  Final   Special Requests BLOOD  Final   Culture NO GROWTH 5 DAYS  Final   Report Status 12/27/2014 FINAL  Final  Blood culture (routine x 2)     Status: None   Collection Time: 2014-12-29  3:02 PM  Result Value Ref Range Status   Specimen Description BLOOD  Final   Special Requests BLOOD  Final   Culture NO GROWTH 5 DAYS  Final   Report Status 12/27/2014 FINAL  Final  Culture, blood (routine x 2)     Status: None (Preliminary result)   Collection Time: 12/27/14  8:58 AM  Result Value Ref Range Status   Specimen Description BLOOD  Final   Special Requests Normal  Final   Culture  Setup Time   Final    GRAM POSITIVE COCCI ANAEROBIC BOTTLE ONLY CRITICAL RESULT CALLED TO, READ BACK BY AND VERIFIED WITH: CALLED TO JULIE BRADDY AT 0250 ON 12/28/14/[facility] CONFIRMED BY PH    Culture    Final    GRAM POSITIVE COCCI ANAEROBIC BOTTLE ONLY IDENTIFICATION TO FOLLOW    Report Status PENDING  Incomplete  Culture, blood (routine x 2)     Status: None (Preliminary result)   Collection Time: 12/27/14  9:00 AM  Result Value Ref Range Status   Specimen Description BLOOD  Final   Special Requests Normal  Final   Culture NO GROWTH < 24 HOURS  Final   Report Status PENDING  Incomplete  Culture, expectorated sputum-assessment     Status: None (Preliminary result)   Collection Time: 12/27/14 11:08 AM  Result Value Ref Range Status   Specimen Description SPUTUM  Final   Special Requests Normal  Final   Sputum evaluation THIS SPECIMEN IS ACCEPTABLE FOR SPUTUM CULTURE  Final   Report Status PENDING  Incomplete  Culture, respiratory (NON-Expectorated)     Status: None (Preliminary result)   Collection Time: 12/27/14 11:08 AM  Result Value Ref Range Status   Specimen Description SPUTUM  Final   Special Requests Normal Reflexed from [id number]  Final   Gram Stain PENDING  Incomplete   Culture   Final    MODERATE GROWTH STAPHYLOCOCCUS AUREUS SUSCEPTIBILITIES TO FOLLOW    Report Status PENDING  Incomplete    Coagulation Studies: No results for input(s): LABPROT, INR in the last 72 hours.  Urinalysis: No results for input(s): COLORURINE, LABSPEC, PHURINE, GLUCOSEU, HGBUR, BILIRUBINUR, KETONESUR, PROTEINUR, UROBILINOGEN, NITRITE, LEUKOCYTESUR in the last 72 hours.  Invalid input(s): APPERANCEUR    Imaging: Dg Chest Port 1 View  12/27/2014   CLINICAL DATA:  Chest congestion.  EXAM: PORTABLE CHEST - 1 VIEW  COMPARISON:  Dec 25, 2014.  FINDINGS: Severe hypoinflation of the lungs is noted. No pneumothorax is noted. Endotracheal tube is noted with distal tip approximately 1 cm above the carina which is slightly improved compared to prior exam. Stable left internal jugular catheter is noted with distal tip overlying expected position of the SVC. Nasogastric tube is seen in the esophagus,  although this distal tip is not well visualized. Mild bibasilar opacities are noted most consistent with subsegmental atelectasis. Bony thorax is intact.  IMPRESSION: Severe hypoinflation of the lungs is noted. Mild bibasilar opacities are noted most consistent with subsegmental atelectasis.  Electronically Signed   By: Lupita Raider, M.D.   On: 12/27/2014 08:23   Dg Abd Portable 1v  12/28/2014   CLINICAL DATA:  Orogastric tube placement at bedside.  EXAM: PORTABLE ABDOMEN - 1 VIEW  COMPARISON:  Portable abdomen x-ray 12/25/2014.  FINDINGS: OG tube tip within the gastric antrum. Right femoral central venous catheter tip projects over the expected location of the right common iliac vein. Visualized bowel gas pattern unremarkable.  Note made of atelectasis in the visualized lung bases.  IMPRESSION: 1. OG tube tip within the gastric antrum. 2. Atelectasis involving the visualized lung bases. 3. No acute abdominal abnormality.   Electronically Signed   By: Hulan Saas M.D.   On: 12/28/2014 08:32     Medications:   . feeding supplement (NEPRO CARB STEADY) 1,000 mL (12/28/14 0720)  . fentaNYL infusion INTRAVENOUS 300 mcg/hr (12/28/14 0720)  . propofol (DIPRIVAN) infusion 20 mcg/kg/min (12/28/14 0720)  . pureflow    . banana bag IV 1000 mL 40 mL/hr at 12/28/14 0720   . albuterol  2.5 mg Nebulization Q6H  . aspirin  81 mg Oral Daily  . atorvastatin  80 mg Oral q1800  . chlorhexidine  15 mL Mouth/Throat BID  . clindamycin (CLEOCIN) IV  600 mg Intravenous 3 times per day  . docusate  100 mg Oral BID  . famotidine (PEPCID) IV  20 mg Intravenous Q24H  . free water  25 mL Per Tube 6 times per day  . heparin  5,000 Units Subcutaneous 3 times per day  . metoprolol  5 mg Intravenous 4 times per day  . sennosides  10 mL Oral BID  . sodium chloride  3 mL Intravenous Q12H   acetaminophen **OR** acetaminophen, heparin, hydrALAZINE, midazolam, [DISCONTINUED] ondansetron **OR** ondansetron (ZOFRAN)  IV  Assessment/ Plan:  69 y.o. male with hypertension, hyperlipidemia, osteoarthritis, gout was admitted to [facility] on 12/16/2014 with right midbrain CVA.   1. Acute renal failure secondary to acute tubular necrosis: placed on CRRT. With hyperkalemia. - nonoliguric urine output now.  - Will try to transition patient off CRRT today.  - monitor volume status, serum electrolytes and urine output.   2. Acute respiratory failure. Secondary to his acute CVA. Patient may not be able to protect his airway. Continue mechanical ventilation.  3. Hypotension: concern for sepsis. Currently getting metoprolol. Continue to follow blood cultures.  - empiric treatment with clindamycin.    LOS: 6 Merlean Pizzini 5/21/20169:07 AM

## 2014-12-28 NOTE — Progress Notes (Signed)
CRRT started

## 2014-12-28 NOTE — Progress Notes (Signed)
Activase given, both ports clotted

## 2014-12-28 NOTE — Progress Notes (Signed)
Patient remains intubated on ventilator and sedated with fentanyl and propofol. Reduced sedation rate this morning, patient never followed commands, was biting on ETT and desatted to 77%, was resedated at lower rate. Abdominal distension and tan secretions suctioned this afternoon, tube feeding currently off. Crrt clotted and stopped around 1030 this am. Tmax 100.9 this evening. Resting quietly between care. Will continue to monitor.

## 2014-12-28 NOTE — Progress Notes (Signed)
[facility]  Daily Progress Note  Patient is s/p placement of a 30cm Duoglide dialysis catheter in his right femoral vein on 12/27/14.  Subjective: Patient sedated and intubated but seems comfortable.   Objective: Filed Vitals:   12/28/14 0900 12/28/14 1000 12/28/14 1100 12/28/14 1200  BP: 131/67 156/66 146/63 153/71  Pulse: 83 110 100 103  Temp:    97 F (36.1 C)  TempSrc:    Axillary  Resp: 16 20 11 14   Height:      Weight:      SpO2: 98% 88% 95% 99%    Intake/Output Summary (Last 24 hours) at 12/28/14 1336 Last data filed at 12/28/14 1100  Gross per 24 hour  Intake 2420.45 ml  Output   2100 ml  Net 320.45 ml    Physical Exam: Sedated and intubated No acute distress CV: RRR Pulmonary: CTA Bilaterally Vascular: Right Groin: Dialysis catheter intact, no signs of bleeding or infection. Line working well.    Laboratory: CBC    Component Value Date/Time   WBC 13.2* 12/28/2014 0517   HGB 8.8* 12/28/2014 0517   HCT 28.2* 12/28/2014 0517   PLT 174 12/28/2014 0517    BMET    Component Value Date/Time   NA 139 12/28/2014 1039   K 5.1 12/28/2014 1039   CL 106 12/28/2014 1039   CO2 26 12/28/2014 1039   GLUCOSE 142* 12/28/2014 1039   BUN 17 12/28/2014 1039   CREATININE 1.86* 12/28/2014 1039   CALCIUM 8.3* 12/28/2014 1039   GFRNONAA 35* 12/28/2014 1039   GFRAA 41* 12/28/2014 1039    Assessment/Planning: Patient is s/p placement of a 30cm Duoglide dialysis catheter in his right femoral vein on 12/27/14. Dialysis catheter intact, no signs of bleeding or infection. Line working well.  Will sign off for now. Please reconsult if needed. Care as per primary team.  Cleda DaubKimberly Stegmayer PA-C 12/28/2014 1:36 PM

## 2014-12-28 NOTE — Progress Notes (Signed)
Dr. Cherylann RatelLateef notified of CRRT continuing to clot off.  Potassium ordered

## 2014-12-28 NOTE — Progress Notes (Signed)
CRRT stopped per MD.

## 2014-12-29 ENCOUNTER — Inpatient Hospital Stay: Payer: Medicare Other

## 2014-12-29 LAB — CBC
HCT: 25.5 % — ABNORMAL LOW (ref 40.0–52.0)
HEMOGLOBIN: 8 g/dL — AB (ref 13.0–18.0)
MCH: 25.7 pg — ABNORMAL LOW (ref 26.0–34.0)
MCHC: 31.3 g/dL — ABNORMAL LOW (ref 32.0–36.0)
MCV: 82.1 fL (ref 80.0–100.0)
PLATELETS: 159 10*3/uL (ref 150–440)
RBC: 3.11 MIL/uL — ABNORMAL LOW (ref 4.40–5.90)
RDW: 19.3 % — ABNORMAL HIGH (ref 11.5–14.5)
WBC: 16.1 10*3/uL — ABNORMAL HIGH (ref 3.8–10.6)

## 2014-12-29 LAB — RENAL FUNCTION PANEL
ALBUMIN: 2.2 g/dL — AB (ref 3.5–5.0)
Anion gap: 7 (ref 5–15)
BUN: 17 mg/dL (ref 6–20)
CO2: 27 mmol/L (ref 22–32)
Calcium: 8 mg/dL — ABNORMAL LOW (ref 8.9–10.3)
Chloride: 107 mmol/L (ref 101–111)
Creatinine, Ser: 2.04 mg/dL — ABNORMAL HIGH (ref 0.61–1.24)
GFR calc Af Amer: 37 mL/min — ABNORMAL LOW (ref >60)
GFR, EST NON AFRICAN AMERICAN: 32 mL/min — AB (ref >60)
Glucose, Bld: 124 mg/dL — ABNORMAL HIGH (ref 65–99)
PHOSPHORUS: 5.2 mg/dL — AB (ref 2.5–4.6)
Potassium: 4.5 mmol/L (ref 3.5–5.1)
SODIUM: 141 mmol/L (ref 135–145)

## 2014-12-29 LAB — GLUCOSE, CAPILLARY
GLUCOSE-CAPILLARY: 105 mg/dL — AB (ref 65–99)
GLUCOSE-CAPILLARY: 97 mg/dL (ref 65–99)
Glucose-Capillary: 94 mg/dL (ref 65–99)
Glucose-Capillary: 91 mg/dL (ref 65–99)
Glucose-Capillary: 84 mg/dL (ref 65–99)

## 2014-12-29 NOTE — Progress Notes (Signed)
Pulmonary Critical Care  Initial Consult Note   Cristie HemLester Isbell ZOX:[id number] DOB: 1946/07/30 DOA: 01/02/2015  Referring physician: Aram Beechamjeffrey Sparks, MD PCP: Ashley MarinerGONZALEZ, CHRISTINA MARIE, MD   Chief Complaint: Acute respiratory failure with hypoxia  HPI: Cristie HemLester Brodzinski is a 69 y.o. male with respiratory failure. Patient is not responsive remains on the vent  Review of Systems:  Patient is note able to provide a ROS  Past Medical History  Diagnosis Date  . Hypertension   . Hyperlipidemia   . Arthritis   . Gout    History reviewed. No pertinent past surgical history. Social History:  reports that he has never smoked. He does not have any smokeless tobacco history on file. He reports that he drinks alcohol. He reports that he does not use illicit drugs.  Allergies  Allergen Reactions  . Penicillins Other (See Comments)    Unknown reaction    Family History  Problem Relation Age of Onset  . Family history unknown: Yes    Prior to Admission medications   Medication Sig Start Date End Date Taking? Authorizing Provider  amLODipine (NORVASC) 5 MG tablet Take 5 mg by mouth daily.   Yes Historical Provider, MD  colchicine 0.6 MG tablet Take 0.6 mg by mouth daily.   Yes Historical Provider, MD  lisinopril (PRINIVIL,ZESTRIL) 40 MG tablet Take 40 mg by mouth daily.   Yes Historical Provider, MD  Multiple Vitamins-Minerals (CENTRUM SILVER ADULT 50+) TABS Take 1 tablet by mouth daily.   Yes Historical Provider, MD  vitamin B-12 (CYANOCOBALAMIN) 1000 MCG tablet Take 1,000 mcg by mouth daily.   Yes Historical Provider, MD   Physical Exam: Filed Vitals:   12/29/14 0815 12/29/14 0900 12/29/14 1000 12/29/14 1100  BP:  136/69 145/71 130/70  Pulse:  91 99 97  Temp:      TempSrc:      Resp:  15 11 13   Height:      Weight:      SpO2: 100% 100% 100% 100%    Wt Readings from Last 3 Encounters:  12/29/14 127 kg (279 lb 15.8 oz)  12/23/14 122.471 kg (270 lb)    General:  Appears calm  sedated Eyes: PERRL, normal lids ENT: Orally intubated Neck: no LAD, massesy Cardiovascular: RRR, no m/r/g. No LE edema. Respiratory: CTA bilaterally, no w/r/r. Normal respiratory effort. Abdomen: soft, nontender Skin: no rash or induration seen on limited exam Psychiatric: sedated Neurologic: flaccid.          Labs on Admission:  Basic Metabolic Panel:  Recent Labs Lab 12/26/14 0457 12/27/14 0443  12/27/14 1805 12/28/14 0203 12/28/14 0517 12/28/14 1039 12/29/14 0515  NA 135 134*  --  135 136 136 139 141  K 4.7 6.5*  < > 6.0* 5.9* 5.7* 5.1 4.5  CL 102 104  --  105 106 106 106 107  CO2 24 24  --  24 24 24 26 27   GLUCOSE 116* 108*  --  104* 119* 125* 142* 124*  BUN 10 12  --  17 18 18 17 17   CREATININE 1.27* 1.61*  --  2.12* 2.03* 2.07* 1.86* 2.04*  CALCIUM 8.3* 8.1*  --  8.0* 8.0* 8.0* 8.3* 8.0*  MG 1.5* 2.1  --  2.2 2.1  --  2.1  --   PHOS 2.4* 5.9*  --  6.6* 6.2* 6.5* 6.1* 5.2*  < > = values in this interval not displayed. Liver Function Tests:  Recent Labs Lab 12/10/2014 1444 12/23/14 0408 12/27/14 1805 12/28/14 [id number]  12/28/14 0517 12/28/14 1039 12/29/14 0515  AST 128* 107*  --   --   --   --   --   ALT 32 29  --   --   --   --   --   ALKPHOS 80 67  --   --   --   --   --   BILITOT 1.7* 1.4*  --   --   --   --   --   PROT 6.9 6.5  --   --   --   --   --   ALBUMIN 4.0 3.6 2.2* 2.2* 2.3* 2.3* 2.2*   No results for input(s): LIPASE, AMYLASE in the last 168 hours. No results for input(s): AMMONIA in the last 168 hours. CBC:  Recent Labs Lab 12/16/2014 1444 12/23/14 0408 12/27/14 0443 12/28/14 0517 12/29/14 0515  WBC 27.6* 14.3* 18.2* 13.2* 16.1*  NEUTROABS 23.5*  --   --   --   --   HGB 11.8* 10.9* 10.3* 8.8* 8.0*  HCT 37.8* 34.0* 32.5* 28.2* 25.5*  MCV 82.1 80.0 81.5 82.8 82.1  PLT 283 267 261 174 159   Cardiac Enzymes:  Recent Labs Lab 01/04/2015 1444  TROPONINI 0.04*    BNP (last 3 results) No results for input(s): BNP in the last 8760  hours.  ProBNP (last 3 results) No results for input(s): PROBNP in the last 8760 hours.  CBG:  Recent Labs Lab 12/28/14 1951 12/28/14 2352 12/29/14 0411 12/29/14 0745 12/29/14 1106  GLUCAP 125* 112* 105* 94 97    Radiological Exams on Admission: US Renal  12/28/2014   CLINICAL DATA:  69 year old male with acute renal failure and tubular necrosis  EXAM: RENAL / URINARY TRACT ULTRASOUND COMPLETE  COMPARISON:  None.  FINDINGS: Right Kidney:  Length: 9.7 cm. No evidence of hydronephrosis. Slightly increased echogenicity of the renal parenchyma. No solid mass or lesion.  Left Kidney:  Length: 10 cm. Limited visualization secondary to patient body habitus. No hydronephrosis. Echogenicity difficult to assess given poor visibility.  Bladder:  Collapsed bladder with Foley catheter in place.  IMPRESSION: 1. No evidence of hydronephrosis. 2. Slightly increased echogenicity of the right renal parenchyma suggests underlying medical renal disease. 3. Poor visualization of the left kidney. 4. Collapsed bladder with Foley catheter in place.   Electronically Signed   By: Malachy Moan M.D.   On: 12/28/2014 10:09   Dg Abd Portable 1v  12/28/2014   CLINICAL DATA:  Orogastric tube placement at bedside.  EXAM: PORTABLE ABDOMEN - 1 VIEW  COMPARISON:  Portable abdomen x-ray 12/25/2014.  FINDINGS: OG tube tip within the gastric antrum. Right femoral central venous catheter tip projects over the expected location of the right common iliac vein. Visualized bowel gas pattern unremarkable.  Note made of atelectasis in the visualized lung bases.  IMPRESSION: 1. OG tube tip within the gastric antrum. 2. Atelectasis involving the visualized lung bases. 3. No acute abdominal abnormality.   Electronically Signed   By: Hulan Saas M.D.   On: 12/28/2014 08:32    EKG: Independently reviewed.  Assessment/Plan Active Problems:   CVA (cerebral infarction)   Seizure disorder   CVA (cerebral vascular accident)    Aspiration into airway   1. Acute Respiratory failure with hypoxia -on PRVC full support on the vent no weaning at this time -will titrate FiO2 as tolerated -will continue with bronchodilators  2. Acute CVA -neurology is following -labs reviewed above  3. Seizures -currently seizure free  4. Aspiration Pneumonia -on empiric antibiotics will continue  Code Status: TEDS  DVT Prophylaxis:heaprin Family Communication: none Disposition Plan: Home Time spent:    I have personally obtained a history, examined the patient, evaluated laboratory and imaging results, formulated the assessment and plan and placed orders.  The Patient requires high complexity decision making for assessment and support.    Yevonne Pax, MD [facility] Pulmonary Critical Care Medicine Sleep Medicine

## 2014-12-29 NOTE — Progress Notes (Signed)
[facility]  ROUNDING NOTE   Subjective:   Taken off CRRT yesterday. Nonoliguric urine output.  K 4.5 T-max 101.5  Objective:  Vital signs in last 24 hours:  Temp:  [97 F (36.1 C)-101.5 F (38.6 C)] 98.4 F (36.9 C) (05/22 0737) Pulse Rate:  [79-110] 91 (05/22 0900) Resp:  [11-20] 15 (05/22 0900) BP: (99-156)/(50-80) 136/69 mmHg (05/22 0900) SpO2:  [88 %-100 %] 100 % (05/22 0900) FiO2 (%):  [30 %] 30 % (05/22 0833) Weight:  [127 kg (279 lb 15.8 oz)] 127 kg (279 lb 15.8 oz) (05/22 0500)  Weight change: 0.2 kg (7.1 oz) Filed Weights   12/27/14 0500 12/28/14 0500 12/29/14 0500  Weight: 118.9 kg (262 lb 2 oz) 126.8 kg (279 lb 8.7 oz) 127 kg (279 lb 15.8 oz)    Intake/Output: I/O last 3 completed shifts: In: 3799.9 [I.V.:2637.1; NG/GT:862.8; IV Piggyback:300] Out: 2450 [Urine:2250; Stool:200]   Intake/Output this shift:  Total I/O In: 25 [NG/GT:25] Out: -   Physical Exam: General: Critically ill, intubated, sedated  Head:  ETT, OGT  Eyes: Eyes closed  Neck: Trachea midline  Lungs:  Clear breath sounds, vent: PRVC FiO2 30%  Heart: Regular rate and rhythm  Abdomen:  +distended, +bowel sounds  Extremities: trace peripheral edema.  Neurologic: Intubated, sedated  Skin: No lesions  Access: Right femoral temp HD catheter. Dr. Wyn Quaker 5/20    Basic Metabolic Panel:  Recent Labs Lab 12/26/14 0457 12/27/14 0443  12/27/14 1805 12/28/14 0203 12/28/14 0517 12/28/14 1039 12/29/14 0515  NA 135 134*  --  135 136 136 139 141  K 4.7 6.5*  < > 6.0* 5.9* 5.7* 5.1 4.5  CL 102 104  --  105 106 106 106 107  CO2 24 24  --  GLUCOSE 116* 108*  --  104* 119* 125* 142* 124*  BUN 10 12  --  CREATININE 1.27* 1.61*  --  2.12* 2.03* 2.07* 1.86* 2.04*  CALCIUM 8.3* 8.1*  --  8.0* 8.0* 8.0* 8.3* 8.0*  MG 1.5* 2.1  --  2.2 2.1  --  2.1  --   PHOS 2.4* 5.9*  --  6.6* 6.2* 6.5* 6.1* 5.2*  < > = values in this interval not displayed.  Liver  Function Tests:  Recent Labs Lab 01/06/2015 1444 12/23/14 0408 12/27/14 1805 12/28/14 0203 12/28/14 0517 12/28/14 1039 12/29/14 0515  AST 128* 107*  --   --   --   --   --   ALT 32 29  --   --   --   --   --   ALKPHOS 80 67  --   --   --   --   --   BILITOT 1.7* 1.4*  --   --   --   --   --   PROT 6.9 6.5  --   --   --   --   --   ALBUMIN 4.0 3.6 2.2* 2.2* 2.3* 2.3* 2.2*   No results for input(s): LIPASE, AMYLASE in the last 168 hours. No results for input(s): AMMONIA in the last 168 hours.  CBC:  Recent Labs Lab 12/31/2014 1444 12/23/14 0408 12/27/14 0443 12/28/14 0517 12/29/14 0515  WBC 27.6* 14.3* 18.2* 13.2* 16.1*  NEUTROABS 23.5*  --   --   --   --   HGB 11.8* 10.9* 10.3* 8.8* 8.0*  HCT 37.8* 34.0* 32.5* 28.2* 25.5*  MCV  82.1 80.0 81.5 82.8 82.1  PLT 283 267 261 174 159    Cardiac Enzymes:  Recent Labs Lab January 19, 2015 1444  TROPONINI 0.04*    BNP: Invalid input(s): POCBNP  CBG:  Recent Labs Lab 12/28/14 1813 12/28/14 1951 12/28/14 2352 12/29/14 0411 12/29/14 0745  GLUCAP 120* 125* 112* 105* 94    Microbiology: Results for orders placed or performed during the hospital encounter of 01-19-2015  Blood culture (routine x 2)     Status: None   Collection Time: 01-19-15  2:44 PM  Result Value Ref Range Status   Specimen Description BLOOD  Final   Special Requests BLOOD  Final   Culture NO GROWTH 5 DAYS  Final   Report Status 12/27/2014 FINAL  Final  Blood culture (routine x 2)     Status: None   Collection Time: 01-19-2015  3:02 PM  Result Value Ref Range Status   Specimen Description BLOOD  Final   Special Requests BLOOD  Final   Culture NO GROWTH 5 DAYS  Final   Report Status 12/27/2014 FINAL  Final  Culture, blood (routine x 2)     Status: None (Preliminary result)   Collection Time: 12/27/14  8:58 AM  Result Value Ref Range Status   Specimen Description BLOOD  Final   Special Requests Normal  Final   Culture  Setup Time   Final    GRAM  POSITIVE COCCI ANAEROBIC BOTTLE ONLY CRITICAL RESULT CALLED TO, READ BACK BY AND VERIFIED WITH: CALLED TO JULIE BRADDY AT 0250 ON 12/28/14/[facility] CONFIRMED BY PH    Culture   Final    GRAM POSITIVE COCCI ANAEROBIC BOTTLE ONLY IDENTIFICATION TO FOLLOW    Report Status PENDING  Incomplete  Culture, blood (routine x 2)     Status: None (Preliminary result)   Collection Time: 12/27/14  9:00 AM  Result Value Ref Range Status   Specimen Description BLOOD  Final   Special Requests Normal  Final   Culture NO GROWTH 2 DAYS  Final   Report Status PENDING  Incomplete  Culture, expectorated sputum-assessment     Status: None (Preliminary result)   Collection Time: 12/27/14 11:08 AM  Result Value Ref Range Status   Specimen Description SPUTUM  Final   Special Requests Normal  Final   Sputum evaluation THIS SPECIMEN IS ACCEPTABLE FOR SPUTUM CULTURE  Final   Report Status PENDING  Incomplete  Culture, respiratory (NON-Expectorated)     Status: None (Preliminary result)   Collection Time: 12/27/14 11:08 AM  Result Value Ref Range Status   Specimen Description SPUTUM  Final   Special Requests Normal Reflexed from [id number]  Final   Gram Stain   Final    FEW WBC SEEN FEW GRAM POSITIVE COCCI IN CLUSTERS GOOD SPECIMEN - 80-90% WBCS    Culture   Final    MODERATE GROWTH STAPHYLOCOCCUS AUREUS SUSCEPTIBILITIES TO FOLLOW    Report Status PENDING  Incomplete    Coagulation Studies: No results for input(s): LABPROT, INR in the last 72 hours.  Urinalysis: No results for input(s): COLORURINE, LABSPEC, PHURINE, GLUCOSEU, HGBUR, BILIRUBINUR, KETONESUR, PROTEINUR, UROBILINOGEN, NITRITE, LEUKOCYTESUR in the last 72 hours.  Invalid input(s): APPERANCEUR    Imaging: US Renal  12/28/2014   CLINICAL DATA:  69 year old male with acute renal failure and tubular necrosis  EXAM: RENAL / URINARY TRACT ULTRASOUND COMPLETE  COMPARISON:  None.  FINDINGS: Right Kidney:  Length: 9.7 cm. No evidence of  hydronephrosis. Slightly increased echogenicity of the renal parenchyma. No  solid mass or lesion.  Left Kidney:  Length: 10 cm. Limited visualization secondary to patient body habitus. No hydronephrosis. Echogenicity difficult to assess given poor visibility.  Bladder:  Collapsed bladder with Foley catheter in place.  IMPRESSION: 1. No evidence of hydronephrosis. 2. Slightly increased echogenicity of the right renal parenchyma suggests underlying medical renal disease. 3. Poor visualization of the left kidney. 4. Collapsed bladder with Foley catheter in place.   Electronically Signed   By: Malachy MoanHeath  McCullough M.D.   On: 12/28/2014 10:09   Dg Abd Portable 1v  12/28/2014   CLINICAL DATA:  Orogastric tube placement at bedside.  EXAM: PORTABLE ABDOMEN - 1 VIEW  COMPARISON:  Portable abdomen x-ray 12/25/2014.  FINDINGS: OG tube tip within the gastric antrum. Right femoral central venous catheter tip projects over the expected location of the right common iliac vein. Visualized bowel gas pattern unremarkable.  Note made of atelectasis in the visualized lung bases.  IMPRESSION: 1. OG tube tip within the gastric antrum. 2. Atelectasis involving the visualized lung bases. 3. No acute abdominal abnormality.   Electronically Signed   By: Hulan Saashomas  Lawrence M.D.   On: 12/28/2014 08:32     Medications:   . feeding supplement (NEPRO CARB STEADY) Stopped (12/28/14 1611)  . fentaNYL infusion INTRAVENOUS 300 mcg/hr (12/29/14 0738)  . propofol (DIPRIVAN) infusion 15 mcg/kg/min (12/29/14 0738)  . banana bag IV 1000 mL 40 mL/hr at 12/29/14 0738   . albuterol  2.5 mg Nebulization Q6H  . aspirin  81 mg Oral Daily  . atorvastatin  80 mg Oral q1800  . chlorhexidine  15 mL Mouth/Throat BID  . clindamycin (CLEOCIN) IV  600 mg Intravenous 3 times per day  . famotidine (PEPCID) IV  20 mg Intravenous Q24H  . free water  25 mL Per Tube 6 times per day  . heparin  5,000 Units Subcutaneous 3 times per day  . metoprolol  5 mg  Intravenous 4 times per day  . sodium chloride  3 mL Intravenous Q12H   acetaminophen **OR** acetaminophen, heparin, hydrALAZINE, midazolam, [DISCONTINUED] ondansetron **OR** ondansetron (ZOFRAN) IV  Assessment/ Plan:  69 y.o. male with hypertension, hyperlipidemia, osteoarthritis, gout was [facility]2014 with right midbrain CVA.   1. Acute renal failure secondary to acute tubular necrosis: placed on CRRT from 5/20 to 5/21. With hyperkalemia now resolved. - nonoliguric urine output now.  - Will try to transition patient off CRRT today.  - monitor volume status, serum electrolytes and urine output.   2. Acute respiratory failure. Secondary to his acute CVA. Intubated for airway protection.  - Concern for aspiration pneumonia - on clindamycin  3. Hypotension: concern for sepsis. Currently getting metoprolol. Continue to follow blood cultures.  - empiric treatment with clindamycin.    LOS: 7 Kyrstan Gotwalt 5/22/20169:15 AM

## 2014-12-29 NOTE — Progress Notes (Signed)
[facility] [place] [facility]   PATIENT NAME: Lucas HemLester Spagnuolo    MR#:  [id number]  DATE OF BIRTH:  01-22-1946  SUBJECTIVE:   intubated sedated   REVIEW OF SYSTEMS:  Unable to perform due to sedation  ROS DRUG ALLERGIES:   Allergies  Allergen Reactions  . Penicillins Other (See Comments)    Unknown reaction    VITALS:  Blood pressure 130/70, pulse 97, temperature 98.4 F (36.9 C), temperature source Axillary, resp. rate 13, height 5\' 6"  (1.676 m), weight 127 kg (279 lb 15.8 oz), SpO2 100 %.  PHYSICAL EXAMINATION:   Physical Exam  Constitutional: No distress.  HENT:  Head: Normocephalic and atraumatic.  Neck: Normal range of motion. Neck supple. No JVD present.  Cardiovascular: Normal rate, regular rhythm and normal heart sounds.   No murmur heard. Pulmonary/Chest: He is in respiratory distress. He has no wheezes.  ET tube in place, vent support.  Abdominal: Bowel sounds are normal. He exhibits no distension and no mass. There is no tenderness.  Neurological:  Sedated on vent support.  Skin: Skin is warm. No rash noted. He is not diaphoretic.    LABORATORY PANEL:   CBC  Recent Labs Lab 12/29/14 0515  WBC 16.1*  HGB 8.0*  HCT 25.5*  PLT 159   ------------------------------------------------------------------------------------------------------------------  Chemistries   Recent Labs Lab 12/23/14 0408  12/28/14 1039 12/29/14 0515  NA 135  < > 139 141  K 2.6*  < > 5.1 4.5  CL 94*  < > 106 107  CO2 31  < > 26 27  GLUCOSE 101*  < > 142* 124*  BUN <5*  < > 17 17  CREATININE 1.09  < > 1.86* 2.04*  CALCIUM 8.1*  < > 8.3* 8.0*  MG 1.8  < > 2.1  --   AST 107*  --   --   --   ALT 29  --   --   --   ALKPHOS 67  --   --   --   BILITOT 1.4*  --   --   --   < > = values in this interval not displayed. ------------------------------------------------------------------------------------------------------------------  Cardiac  Enzymes  Recent Labs Lab 12/18/2014 1444  TROPONINI 0.04*   ------------------------------------------------------------------------------------------------------------------  RADIOLOGY:  Dg Chest 2 View  12/25/2014   CLINICAL DATA:  Possible aspiration.  Abnormal lung sounds.  EXAM: CHEST  2 VIEW  COMPARISON:  None.  FINDINGS: The heart size and mediastinal contours are within normal limits. Both lungs are clear. No pneumothorax or pleural effusion is noted. The visualized skeletal structures are unremarkable.  IMPRESSION: No active cardiopulmonary disease.   Electronically Signed   By: Lupita RaiderJames  Green Jr, M.D.   On: 01/02/2015 15:02   Ct Head (brain) Wo Contrast  01/02/2015   IMPRESSION: Minimal diffuse cortical atrophy. Mild chronic ischemic white matter disease. No acute intracranial abnormality seen. These results were called by telephone at the time of interpretation on 01/04/2015 at 2:42 pm to Dr. Lenard LancePaduchowski, who verbally acknowledged these results.   Electronically Signed   By: Lupita RaiderJames  Green Jr, M.D.   On: 12/21/2014 14:44   Portable Chest 1 View  12/23/2014   IMPRESSION: Cardiomegaly  Calcified tortuous aorta  No obvious infiltrate, congestive heart failure or pneumothorax.  Evaluation limited by portable technique and patient's habitus.   Electronically Signed   By: Lacy DuverneySteven  Olson M.D.   On: 12/23/2014 07:31     ASSESSMENT AND PLAN:  This is 69 year old male with a history of hypertension and gout he was found by his family with left facial droop and left-sided weakness. He also has been drinking EtOH heavily. He had a seizure in the ER.  * sepsis - MSSA in blood and sputum cultures - continue clindamycin  * acute respiratory failure due to Aspiration pneumonia - appreciate pulmonology following - on full vent support - continue clindamycin  * Stroke:   - right pontine infarct. - appreciate neurology consultation - continue ASA statin  * Seizure: patient had a witnessed  seizure in the emergency department. I suspect this is related to alcohol drinking or stroke.   Neurology recommends continuing Keppra 500 mg for 3 days then stop.  * hyperkalemia  improved   CRRT per nephro on 12/27/14- stopped 5/21  *  EtOH dependence: sedation now.  * Essential hypertension: Patient is on lisinopril and Norvasc at home,   Here on hydralazine when necessary.  Meeting with palliative care again on Monday.  Management plans discussed with the patient's sister who is in agreement.  CODE STATUS FULL  TOTAL TIME TAKING CARE OF THIS PATIENT: 40 min critical care.    Elby Showers M.D on 12/29/2014 at 1:38 PM  Between 7am to 6pm - Pager - [phone] After 6pm go to [email or web address] - password [email or web address] [facility]  [place] [facility]  Office  [phone]  CC: Primary care physician; Ashley Mariner, MD

## 2014-12-29 NOTE — Progress Notes (Signed)
PHARMACY - CRITICAL CARE PROGRESS NOTE  Pharmacy Consult for Electrolyte management and Constipation prevention  Indication: ICU status requiring mechanical ventilation     Allergies  Allergen Reactions  . Penicillins Other (See Comments)    Unknown reaction    Patient Measurements: Height: 5\' 6"  (167.6 cm) Weight: 279 lb 15.8 oz (127 kg) IBW/kg (Calculated) : 63.8   Vital Signs: Temp: 98.4 F (36.9 C) (05/22 0737) Temp Source: Axillary (05/22 0737) BP: 120/63 mmHg (05/22 0700) Pulse Rate: 81 (05/22 0700) Intake/Output from previous day: 05/21 0701 - 05/22 0700 In: 2496.8 [I.V.:1819; NG/GT:477.8; IV Piggyback:200] Out: 1125 [Urine:925; Stool:200] Intake/Output from this shift:   Vent settings for last 24 hours: Vent Mode:  [-] PRVC FiO2 (%):  [30 %] 30 % Set Rate:  [16 bmp] 16 bmp Vt Set:  [500 mL] 500 mL PEEP:  [5 cmH20] 5 cmH20  Labs:  Recent Labs  12/27/14 0443  12/27/14 1805 12/28/14 0203 12/28/14 0517 12/28/14 1039 12/29/14 0515  WBC 18.2*  --   --   --  13.2*  --  16.1*  HGB 10.3*  --   --   --  8.8*  --  8.0*  HCT 32.5*  --   --   --  28.2*  --  25.5*  PLT 261  --   --   --  174  --  159  APTT  --   --   --   --  36  --   --   CREATININE 1.61*  --  2.12* 2.03* 2.07* 1.86* 2.04*  MG 2.1  --  2.2 2.1  --  2.1  --   PHOS 5.9*  --  6.6* 6.2* 6.5* 6.1* 5.2*  ALBUMIN  --   < > 2.2* 2.2* 2.3* 2.3* 2.2*  < > = values in this interval not displayed. Estimated Creatinine Clearance: 43.1 mL/min (by C-G formula based on Cr of 2.04).   Recent Labs  12/28/14 1951 12/28/14 2352 12/29/14 0411  GLUCAP 125* 112* 105*    Microbiology: Recent Results (from the past 720 hour(s))  Blood culture (routine x 2)     Status: None   Collection Time: May 11, 2015  2:44 PM  Result Value Ref Range Status   Specimen Description BLOOD  Final   Special Requests BLOOD  Final   Culture NO GROWTH 5 DAYS  Final   Report Status 12/27/2014 FINAL  Final  Blood culture (routine x  2)     Status: None   Collection Time: May 11, 2015  3:02 PM  Result Value Ref Range Status   Specimen Description BLOOD  Final   Special Requests BLOOD  Final   Culture NO GROWTH 5 DAYS  Final   Report Status 12/27/2014 FINAL  Final  Culture, blood (routine x 2)     Status: None (Preliminary result)   Collection Time: 12/27/14  8:58 AM  Result Value Ref Range Status   Specimen Description BLOOD  Final   Special Requests Normal  Final   Culture  Setup Time   Final    GRAM POSITIVE COCCI ANAEROBIC BOTTLE ONLY CRITICAL RESULT CALLED TO, READ BACK BY AND VERIFIED WITH: CALLED TO JULIE BRADDY AT 0250 ON 12/28/14/[facility]WW CONFIRMED BY PH    Culture   Final    GRAM POSITIVE COCCI ANAEROBIC BOTTLE ONLY IDENTIFICATION TO FOLLOW    Report Status PENDING  Incomplete  Culture, blood (routine x 2)     Status: None (Preliminary result)   Collection Time:  12/27/14  9:00 AM  Result Value Ref Range Status   Specimen Description BLOOD  Final   Special Requests Normal  Final   Culture NO GROWTH < 24 HOURS  Final   Report Status PENDING  Incomplete  Culture, expectorated sputum-assessment     Status: None (Preliminary result)   Collection Time: 12/27/14 11:08 AM  Result Value Ref Range Status   Specimen Description SPUTUM  Final   Special Requests Normal  Final   Sputum evaluation THIS SPECIMEN IS ACCEPTABLE FOR SPUTUM CULTURE  Final   Report Status PENDING  Incomplete  Culture, respiratory (NON-Expectorated)     Status: None (Preliminary result)   Collection Time: 12/27/14 11:08 AM  Result Value Ref Range Status   Specimen Description SPUTUM  Final   Special Requests Normal Reflexed from [id number]  Final   Gram Stain   Final    FEW WBC SEEN FEW GRAM POSITIVE COCCI IN CLUSTERS GOOD SPECIMEN - 80-90% WBCS    Culture   Final    MODERATE GROWTH STAPHYLOCOCCUS AUREUS SUSCEPTIBILITIES TO FOLLOW    Report Status PENDING  Incomplete    Medications:  Scheduled:  . albuterol  2.5 mg Nebulization Q6H   . aspirin  81 mg Oral Daily  . atorvastatin  80 mg Oral q1800  . chlorhexidine  15 mL Mouth/Throat BID  . clindamycin (CLEOCIN) IV  600 mg Intravenous 3 times per day  . famotidine (PEPCID) IV  20 mg Intravenous Q24H  . free water  25 mL Per Tube 6 times per day  . heparin  5,000 Units Subcutaneous 3 times per day  . metoprolol  5 mg Intravenous 4 times per day  . sodium chloride  3 mL Intravenous Q12H   Infusions:  . feeding supplement (NEPRO CARB STEADY) Stopped (12/28/14 1611)  . fentaNYL infusion INTRAVENOUS 10 mcg/hr (12/29/14 0114)  . propofol (DIPRIVAN) infusion 15 mcg/kg/min (12/29/14 0524)  . banana bag IV 1000 mL 40 mL/hr at 12/28/14 2027    Assessment: 69 yo male critical care patient requiring mechanical ventilation. Patient with worsening renal function and continued hyperkalemia. Patient received 30g of SPS this am. Follow-up potassium was 6.8. Nephrology has been consulted.    Plan:   1. Electrolytes - All labs wnl except phosphorus elevated @ 5.2 but trending downward.   2. Constipation - RN called and stated patient has significant diarrhea. Will disontinue all bowel regimen agents.   Demetrius Charity, PharmD  12/29/2014,7:39 AM

## 2014-12-29 NOTE — Progress Notes (Signed)
Pt remains on vent, sedated with fentanyl and propofol, withdraws to pain.  NSR, lungs coarse.  OG tube clotted earlier this afternoon, OG tube now in place and verified by x-ray and audible air injection, feeds resumed.  Adequate urine output.  VSS, low grade fever (100 oral) since 1600.

## 2014-12-30 LAB — BASIC METABOLIC PANEL
Anion gap: 7 (ref 5–15)
BUN: 16 mg/dL (ref 6–20)
CO2: 27 mmol/L (ref 22–32)
Calcium: 7.9 mg/dL — ABNORMAL LOW (ref 8.9–10.3)
Chloride: 105 mmol/L (ref 101–111)
Creatinine, Ser: 2.07 mg/dL — ABNORMAL HIGH (ref 0.61–1.24)
GFR calc non Af Amer: 31 mL/min — ABNORMAL LOW (ref >60)
GFR, EST AFRICAN AMERICAN: 36 mL/min — AB (ref >60)
GLUCOSE: 114 mg/dL — AB (ref 65–99)
Potassium: 3.8 mmol/L (ref 3.5–5.1)
Sodium: 139 mmol/L (ref 135–145)

## 2014-12-30 LAB — GLUCOSE, CAPILLARY
GLUCOSE-CAPILLARY: 101 mg/dL — AB (ref 65–99)
GLUCOSE-CAPILLARY: 110 mg/dL — AB (ref 65–99)
GLUCOSE-CAPILLARY: 120 mg/dL — AB (ref 65–99)
GLUCOSE-CAPILLARY: 96 mg/dL (ref 65–99)
GLUCOSE-CAPILLARY: 98 mg/dL (ref 65–99)
Glucose-Capillary: 108 mg/dL — ABNORMAL HIGH (ref 65–99)
Glucose-Capillary: 85 mg/dL (ref 65–99)

## 2014-12-30 LAB — CBC
HCT: 23.5 % — ABNORMAL LOW (ref 40.0–52.0)
Hemoglobin: 7.3 g/dL — ABNORMAL LOW (ref 13.0–18.0)
MCH: 25.6 pg — AB (ref 26.0–34.0)
MCHC: 31.1 g/dL — ABNORMAL LOW (ref 32.0–36.0)
MCV: 82.4 fL (ref 80.0–100.0)
Platelets: 189 10*3/uL (ref 150–440)
RBC: 2.85 MIL/uL — ABNORMAL LOW (ref 4.40–5.90)
RDW: 19.5 % — ABNORMAL HIGH (ref 11.5–14.5)
WBC: 17.6 10*3/uL — ABNORMAL HIGH (ref 3.8–10.6)

## 2014-12-30 LAB — CULTURE, RESPIRATORY W GRAM STAIN

## 2014-12-30 LAB — CULTURE, RESPIRATORY: SPECIAL REQUESTS: NORMAL

## 2014-12-30 LAB — MAGNESIUM: MAGNESIUM: 2 mg/dL (ref 1.7–2.4)

## 2014-12-30 LAB — PHOSPHORUS: Phosphorus: 4.9 mg/dL — ABNORMAL HIGH (ref 2.5–4.6)

## 2014-12-30 MED ORDER — VANCOMYCIN HCL 10 G IV SOLR
1250.0000 mg | INTRAVENOUS | Status: DC
  Administered 2014-12-30 – 2015-01-02 (×5): 1250 mg via INTRAVENOUS
  Filled 2014-12-30 (×7): qty 1250

## 2014-12-30 MED ORDER — CEFAZOLIN SODIUM 1-5 GM-% IV SOLN
1.0000 g | Freq: Two times a day (BID) | INTRAVENOUS | Status: DC
  Administered 2014-12-30 – 2015-01-01 (×4): 1 g via INTRAVENOUS
  Filled 2014-12-30 (×6): qty 50

## 2014-12-30 MED ORDER — VANCOMYCIN HCL 10 G IV SOLR
1250.0000 mg | Freq: Once | INTRAVENOUS | Status: AC
  Administered 2014-12-30: 1250 mg via INTRAVENOUS
  Filled 2014-12-30: qty 1250

## 2014-12-30 NOTE — Progress Notes (Signed)
PHARMACY - CRITICAL CARE PROGRESS NOTE  Pharmacy Consult for Vancomycin dosing, Electrolyte management and Constipation prevention  Indication: Bacteremia, ICU status requiring mechanical ventilation     Allergies  Allergen Reactions  . Penicillins Other (See Comments)    Unknown reaction    Patient Measurements: Height:  (167.6 cm) Weight: 286 lb 6 oz (129.9 kg) IBW/kg (Calculated) : 63.8   Vital Signs: Temp: 100.2 F (37.9 C) (05/23 1900) Temp Source: Oral (05/23 1900) BP: 137/66 mmHg (05/23 2000) Pulse Rate: 95 (05/23 2000) Intake/Output from previous day: 05/22 0701 - 05/23 0700 In: 2653 [I.V.:2012.2; NG/GT:540.8; IV Piggyback:100] Out: 1300 [Urine:1100; Stool:200] Intake/Output from this shift: Total I/O In: 3 [I.V.:3] Out: -  Vent settings for last 24 hours: Vent Mode:  [-] PRVC FiO2 (%):  [24 %-30 %] 24 % Set Rate:  [16 bmp] 16 bmp Vt Set:  [500 mL] 500 mL PEEP:  [5 cmH20] 5 cmH20 Plateau Pressure:  [15 cmH20] 15 cmH20  Labs:  Recent Labs  12/28/14 0203 12/28/14 0517 12/28/14 1039 12/29/14 0515 12/30/14 0458  WBC  --  13.2*  --  16.1* 17.6*  HGB  --  8.8*  --  8.0* 7.3*  HCT  --  28.2*  --  25.5* 23.5*  PLT  --  174  --  159 189  APTT  --  36  --   --   --   CREATININE 2.03* 2.07* 1.86* 2.04* 2.07*  MG 2.1  --  2.1  --  2.0  PHOS 6.2* 6.5* 6.1* 5.2* 4.9*  ALBUMIN 2.2* 2.3* 2.3* 2.2*  --    Estimated Creatinine Clearance: 43 mL/min (by C-G formula based on Cr of 2.07).   Recent Labs  12/30/14 1137 12/30/14 1646 12/30/14 2001  GLUCAP 110* 120* 101*    Microbiology: Recent Results (from the past 720 hour(s))  Blood culture (routine x 2)     Status: None   Collection Time: 01/07/2015  2:44 PM  Result Value Ref Range Status   Specimen Description BLOOD  Final   Special Requests BLOOD  Final   Culture NO GROWTH 5 DAYS  Final   Report Status 12/27/2014 FINAL  Final  Blood culture (routine x 2)     Status: None   Collection Time:  01/05/2015  3:02 PM  Result Value Ref Range Status   Specimen Description BLOOD  Final   Special Requests BLOOD  Final   Culture NO GROWTH 5 DAYS  Final   Report Status 12/27/2014 FINAL  Final  Culture, blood (routine x 2)     Status: None (Preliminary result)   Collection Time: 12/27/14  8:58 AM  Result Value Ref Range Status   Specimen Description BLOOD  Final   Special Requests Normal  Final   Culture  Setup Time   Final    GRAM POSITIVE COCCI ANAEROBIC BOTTLE ONLY CRITICAL RESULT CALLED TO, READ BACK BY AND VERIFIED WITH: CALLED TO JULIE BRADDY AT 0250 ON 12/28/14/RWW CONFIRMED BY PH    Culture ENTEROCOCCUS FAECALIS ANAEROBIC BOTTLE ONLY   Final   Report Status PENDING  Incomplete   Organism ID, Bacteria ENTEROCOCCUS FAECALIS  Final      Susceptibility   Enterococcus faecalis - MIC*    AMPICILLIN <=2 SENSITIVE Sensitive     LINEZOLID 2 SENSITIVE Sensitive     * ENTEROCOCCUS FAECALIS  Culture, blood (routine x 2)     Status: None (Preliminary result)   Collection Time: 12/27/14  9:00 AM  Result Value Ref Range Status   Specimen Description BLOOD  Final   Special Requests Normal  Final   Culture NO GROWTH 2 DAYS  Final   Report Status PENDING  Incomplete  Culture, expectorated sputum-assessment     Status: None (Preliminary result)   Collection Time: 12/27/14 11:08 AM  Result Value Ref Range Status   Specimen Description SPUTUM  Final   Special Requests Normal  Final   Sputum evaluation THIS SPECIMEN IS ACCEPTABLE FOR SPUTUM CULTURE  Final   Report Status PENDING  Incomplete  Culture, respiratory (NON-Expectorated)     Status: None   Collection Time: 12/27/14 11:08 AM  Result Value Ref Range Status   Specimen Description SPUTUM  Final   Special Requests Normal Reflexed from [id number]  Final   Gram Stain   Final    FEW WBC SEEN FEW GRAM POSITIVE COCCI IN CLUSTERS GOOD SPECIMEN - 80-90% WBCS    Culture MODERATE GROWTH STAPHYLOCOCCUS AUREUS  Final   Report Status  12/30/2014 FINAL  Final   Organism ID, Bacteria STAPHYLOCOCCUS AUREUS  Final      Susceptibility   Staphylococcus aureus - MIC*    CIPROFLOXACIN <=0.5 SENSITIVE Sensitive     ERYTHROMYCIN <=0.25 SENSITIVE Sensitive     GENTAMICIN <=0.5 SENSITIVE Sensitive     OXACILLIN 0.5 SENSITIVE Sensitive     TETRACYCLINE <=1 SENSITIVE Sensitive     TRIMETH/SULFA <=10 SENSITIVE Sensitive     CLINDAMYCIN <=0.25 SENSITIVE Sensitive     CEFOXITIN SCREEN NEGATIVE Sensitive     Inducible Clindamycin NEGATIVE Sensitive     * MODERATE GROWTH STAPHYLOCOCCUS AUREUS    Medications:  Scheduled:  . albuterol  2.5 mg Nebulization Q6H  . aspirin  81 mg Oral Daily  . atorvastatin  80 mg Oral q1800  .  ceFAZolin (ANCEF) IV  1 g Intravenous Q12H  . chlorhexidine  15 mL Mouth/Throat BID  . famotidine (PEPCID) IV  20 mg Intravenous Q24H  . free water  25 mL Per Tube 6 times per day  . heparin  5,000 Units Subcutaneous 3 times per day  . metoprolol  5 mg Intravenous 4 times per day  . sodium chloride  3 mL Intravenous Q12H  . vancomycin  1,250 mg Intravenous Q18H   Infusions:  . feeding supplement (NEPRO CARB STEADY) 1,000 mL (12/30/14 1800)  . fentaNYL infusion INTRAVENOUS Stopped (12/30/14 1101)  . propofol (DIPRIVAN) infusion 20 mcg/kg/min (12/30/14 1924)  . banana bag IV 1000 mL 40 mL/hr at 12/30/14 1800    Assessment: 69 yo male critical care patient requiring mechanical ventilation. Patient with worsening renal function and continued hyperkalemia. Patient received 30g of SPS this am. Follow-up potassium was 6.8. Nephrology has been consulted.    Plan:   1. Vancomycin Dosing: Patient transitioned to vancomycin for E.faecalis bacteremia. Will start patient on vancomycin 1250mg  IV Q18hr for goal trough of 15-20. Will obtain trough prior to 4th dose of vancomycin. Patient also on cefazolin for MSSA in sputum.   2. Electrolytes - Will continue to monitor with am labs.     3. Constipation - Patient had  significant diarrhea over the weekend. Will continue holding docusate/senna.   Charlies SilversMichael Emersyn Ortega, PharmD  12/30/2014,10:36 PM

## 2014-12-30 NOTE — Progress Notes (Signed)
Dr. Servando SnareWohl on unit to assess patient for PEG tube placement. I spoke with Dr. Belia HemanKasa, who will be addressing family wishes and with pallative care team.  Advised Dr. Servando SnareWohl per Dr. Belia HemanKasa we will re-consult if family decides on PEG placement. Alcide GoodnessPam Emori Kamau, RN

## 2014-12-30 NOTE — Progress Notes (Signed)
Nutrition Follow-up  DOCUMENTATION CODES:     INTERVENTION:   (Coordination of care: Tube feeding currently on hold as planning extubation.  Will reassess in am if unable to wean.  )  NUTRITION DIAGNOSIS:  Inadequate oral intake related to inability to eat as evidenced by NPO status.    GOAL:   (Goal would be for pt to tolerate tube feeding at goal rate)    MONITOR:   (EN, Electrolyte and Renal Profile, Digestive system, Pulmonary Profile)  REASON FOR ASSESSMENT:   (follow-up) Enteral/tube feeding initiation and management  ASSESSMENT:  Planning extubation today, sedation of hold.   Noted OG clotted yesterday, new tube placed.    Electrolyte and Renal Profile:    Recent Labs Lab 12/28/14 0203  12/28/14 1039 12/29/14 0515 12/30/14 0458  BUN 18  < > 17 17 16   CREATININE 2.03*  < > 1.86* 2.04* 2.07*  NA 136  < > 139 141 139  K 5.9*  < > 5.1 4.5 3.8  MG 2.1  --  2.1  --  2.0  PHOS 6.2*  < > 6.1* 5.2* 4.9*  < > = values in this interval not displayed.  Medications: reviewed  Height:  Ht Readings from Last 1 Encounters:  2015/01/21 5\' 6"  (1.676 m)    Weight:  Wt Readings from Last 1 Encounters:  12/30/14 286 lb 6 oz (129.9 kg)       Wt Readings from Last 10 Encounters:  12/30/14 286 lb 6 oz (129.9 kg)  12/23/14 270 lb (122.471 kg)    BMI:  Body mass index is 46.24 kg/(m^2).  Estimated Nutritional Needs:  Kcal:  1298-1652kcals, (11-14kcals/kg) using acutal body weight of 118kg  Protein:  129-161g protein (2.0-2.5g/kg) using IBW of 64.5kg  Fluid:  1613-196035mL of fluid (25-830mL/kg) using IBW of 64.5kg  Skin:  Reviewed, no issues  Diet Order:  Diet NPO time specified  EDUCATION NEEDS:  No education needs identified at this time   Intake/Output Summary (Last 24 hours) at 12/30/14 1304 Last data filed at 12/30/14 1200  Gross per 24 hour  Intake 2743.5 ml  Output   1575 ml  Net 1168.5 ml    Last BM:  5/22  HIGH Care Level [place] Dorval B.  Freida BusmanAllen, RD, LDN [phone] (pager)

## 2014-12-30 NOTE — Progress Notes (Signed)
I spoke with Dr. Belia HemanKasa- updated him that patient has been off sedation the past few hours- patient is still not awake or following commands.  Asked Dr. Belia HemanKasa if he would like to add any other IV sedation/pain pushes prn besides versed and for chest x-ray. Dr. Belia HemanKasa declined and just stated to let him know when patient wakes up.

## 2014-12-30 NOTE — Consult Note (Signed)
Reason for Consult:Sepsis, PNA,     Referring Physician:   Active Problems:   CVA (cerebral infarction)   Seizure disorder   CVA (cerebral vascular accident)   Aspiration into airway  HPI: Lucas Ortega is a 69 y.o. male admitted 5/15 with CVA, followed by a seizure, requiring intubation. He has developed pna with MSSA on culture, as well as enterococcal bacteremia. He has a flexiseal in place and a central line. Remains intubated but not on any pressors  Past Medical History  Diagnosis Date  . Hypertension   . Hyperlipidemia   . Arthritis   . Gout    History reviewed. No pertinent past surgical history.  Allergies:  Allergies  Allergen Reactions  . Penicillins Other (See Comments)    Unknown reaction   Current antibiotics: Antibiotics Given (last 72 hours)    Date/Time Action Medication Dose Rate   12/27/14 2219 Given   clindamycin (CLEOCIN) IVPB 600 mg 600 mg 100 mL/hr   12/28/14 0554 Given   clindamycin (CLEOCIN) IVPB 600 mg 600 mg 100 mL/hr   12/28/14 1355 Given   clindamycin (CLEOCIN) IVPB 600 mg 600 mg 100 mL/hr   12/28/14 2145 Given   clindamycin (CLEOCIN) IVPB 600 mg 600 mg 100 mL/hr   12/29/14 0524 Given   clindamycin (CLEOCIN) IVPB 600 mg 600 mg 100 mL/hr   12/29/14 1309 Given   clindamycin (CLEOCIN) IVPB 600 mg 600 mg 100 mL/hr   12/29/14 2135 Given   clindamycin (CLEOCIN) IVPB 600 mg 600 mg 100 mL/hr   12/30/14 0947 Given   clindamycin (CLEOCIN) IVPB 600 mg 600 mg 100 mL/hr   12/30/14 1311 Given   vancomycin (VANCOCIN) 1,250 mg in sodium chloride 0.9 % 250 mL IVPB 1,250 mg 166.7 mL/hr      Antibiotics Given (last 72 hours)    Date/Time Action Medication Dose Rate   12/27/14 2219 Given   clindamycin (CLEOCIN) IVPB 600 mg 600 mg 100 mL/hr   12/28/14 0554 Given   clindamycin (CLEOCIN) IVPB 600 mg 600 mg 100 mL/hr   12/28/14 1355 Given   clindamycin (CLEOCIN) IVPB 600 mg 600 mg 100 mL/hr   12/28/14 2145 Given   clindamycin (CLEOCIN) IVPB 600 mg  600 mg 100 mL/hr   12/29/14 0524 Given   clindamycin (CLEOCIN) IVPB 600 mg 600 mg 100 mL/hr   12/29/14 1309 Given   clindamycin (CLEOCIN) IVPB 600 mg 600 mg 100 mL/hr   12/29/14 2135 Given   clindamycin (CLEOCIN) IVPB 600 mg 600 mg 100 mL/hr   12/30/14 0609 Given   clindamycin (CLEOCIN) IVPB 600 mg 600 mg 100 mL/hr   12/30/14 1311 Given   vancomycin (VANCOCIN) 1,250 mg in sodium chloride 0.9 % 250 mL IVPB 1,250 mg 166.7 mL/hr       MEDICATIONS: . albuterol  2.5 mg Nebulization Q6H  . aspirin  81 mg Oral Daily  . atorvastatin  80 mg Oral q1800  . chlorhexidine  15 mL Mouth/Throat BID  . famotidine (PEPCID) IV  20 mg Intravenous Q24H  . free water  25 mL Per Tube 6 times per day  . heparin  5,000 Units Subcutaneous 3 times per day  . metoprolol  5 mg Intravenous 4 times per day  . sodium chloride  3 mL Intravenous Q12H  . vancomycin  1,250 mg Intravenous Q18H    History  Substance Use Topics  . Smoking status: Never Smoker   . Smokeless tobacco: Not on file  . Alcohol Use: 0.0 oz/week  0 Standard drinks or equivalent per week     Comment: GIN every day heavy    Family History  Problem Relation Age of Onset  . Family history unknown: Yes    Review of Systems - unable to obtain  OBJECTIVE: Temp:  [99.2 F (37.3 C)-102.2 F (39 C)] 102.2 F (39 C) (05/23 1500) Pulse Rate:  [71-116] 105 (05/23 1500) Resp:  [0-26] 20 (05/23 1500) BP: (103-166)/(52-100) 142/58 mmHg (05/23 1500) SpO2:  [91 %-100 %] 97 % (05/23 1500) FiO2 (%):  [30 %] 30 % (05/23 1145) Weight:  [129.9 kg (286 lb 6 oz)] 129.9 kg (286 lb 6 oz) (05/23 0500) Constitutional: He is critically ill appearing HENT:  ETT in place Neck: Normal range of motion. Neck supple. No JVD present.  Cardiovascular: Normal rate, regular rhythm and normal heart sounds.  No murmur heard. Pulmonary/Chest: bil rhonchi ET tube in place, vent support.  Abdominal: Bowel sounds are normal. He exhibits no distension and no  mass. There is no tenderness.  Neurological:  Sedated on vent support.  Foley, rectal tube in place Neck line on L- no redness Skin: Skin is warm. No rash noted. He is not diaphoretic  LABS: Results for orders placed or performed during the hospital encounter of 12/24/2014 (from the past 48 hour(s))  Glucose, capillary     Status: Abnormal   Collection Time: 12/28/14  6:13 PM  Result Value Ref Range   Glucose-Capillary 120 (H) 65 - 99 mg/dL  Glucose, capillary     Status: Abnormal   Collection Time: 12/28/14  7:51 PM  Result Value Ref Range   Glucose-Capillary 125 (H) 65 - 99 mg/dL   Comment 1 Document in Chart   Glucose, capillary     Status: Abnormal   Collection Time: 12/28/14 11:52 PM  Result Value Ref Range   Glucose-Capillary 112 (H) 65 - 99 mg/dL  Glucose, capillary     Status: Abnormal   Collection Time: 12/29/14  4:11 AM  Result Value Ref Range   Glucose-Capillary 105 (H) 65 - 99 mg/dL   Comment 1 Notify RN    Comment 2 Document in Chart   Renal function panel     Status: Abnormal   Collection Time: 12/29/14  5:15 AM  Result Value Ref Range   Sodium 141 135 - 145 mmol/L   Potassium 4.5 3.5 - 5.1 mmol/L   Chloride 107 101 - 111 mmol/L   CO2 27 22 - 32 mmol/L   Glucose, Bld 124 (H) 65 - 99 mg/dL   BUN 17 6 - 20 mg/dL   Creatinine, Ser 2.04 (H) 0.61 - 1.24 mg/dL   Calcium 8.0 (L) 8.9 - 10.3 mg/dL   Phosphorus 5.2 (H) 2.5 - 4.6 mg/dL   Albumin 2.2 (L) 3.5 - 5.0 g/dL   GFR calc non Af Amer 32 (L) >60 mL/min   GFR calc Af Amer 37 (L) >60 mL/min    Comment: (NOTE) The eGFR has been calculated using the CKD EPI equation. This calculation has not been validated in all clinical situations. eGFR's persistently <60 mL/min signify possible Chronic Kidney Disease.    Anion gap 7 5 - 15  CBC     Status: Abnormal   Collection Time: 12/29/14  5:15 AM  Result Value Ref Range   WBC 16.1 (H) 3.8 - 10.6 K/uL   RBC 3.11 (L) 4.40 - 5.90 MIL/uL   Hemoglobin 8.0 (L) 13.0 - 18.0  g/dL   HCT 25.5 (L) 40.0 -  52.0 %   MCV 82.1 80.0 - 100.0 fL   MCH 25.7 (L) 26.0 - 34.0 pg   MCHC 31.3 (L) 32.0 - 36.0 g/dL   RDW 19.3 (H) 11.5 - 14.5 %   Platelets 159 150 - 440 K/uL  Glucose, capillary     Status: None   Collection Time: 12/29/14  7:45 AM  Result Value Ref Range   Glucose-Capillary 94 65 - 99 mg/dL  Glucose, capillary     Status: None   Collection Time: 12/29/14 11:06 AM  Result Value Ref Range   Glucose-Capillary 97 65 - 99 mg/dL  Glucose, capillary     Status: None   Collection Time: 12/29/14  6:09 PM  Result Value Ref Range   Glucose-Capillary 91 65 - 99 mg/dL  Glucose, capillary     Status: None   Collection Time: 12/29/14  8:20 PM  Result Value Ref Range   Glucose-Capillary 84 65 - 99 mg/dL  Glucose, capillary     Status: None   Collection Time: 12/30/14 12:03 AM  Result Value Ref Range   Glucose-Capillary 98 65 - 99 mg/dL  Glucose, capillary     Status: None   Collection Time: 12/30/14  3:45 AM  Result Value Ref Range   Glucose-Capillary 85 65 - 99 mg/dL  CBC     Status: Abnormal   Collection Time: 12/30/14  4:58 AM  Result Value Ref Range   WBC 17.6 (H) 3.8 - 10.6 K/uL   RBC 2.85 (L) 4.40 - 5.90 MIL/uL   Hemoglobin 7.3 (L) 13.0 - 18.0 g/dL   HCT 23.5 (L) 40.0 - 52.0 %   MCV 82.4 80.0 - 100.0 fL   MCH 25.6 (L) 26.0 - 34.0 pg   MCHC 31.1 (L) 32.0 - 36.0 g/dL   RDW 19.5 (H) 11.5 - 14.5 %   Platelets 189 150 - 440 K/uL  Basic metabolic panel     Status: Abnormal   Collection Time: 12/30/14  4:58 AM  Result Value Ref Range   Sodium 139 135 - 145 mmol/L   Potassium 3.8 3.5 - 5.1 mmol/L   Chloride 105 101 - 111 mmol/L   CO2 27 22 - 32 mmol/L   Glucose, Bld 114 (H) 65 - 99 mg/dL   BUN 16 6 - 20 mg/dL   Creatinine, Ser 2.07 (H) 0.61 - 1.24 mg/dL   Calcium 7.9 (L) 8.9 - 10.3 mg/dL   GFR calc non Af Amer 31 (L) >60 mL/min   GFR calc Af Amer 36 (L) >60 mL/min    Comment: (NOTE) The eGFR has been calculated using the CKD EPI equation. This  calculation has not been validated in all clinical situations. eGFR's persistently <60 mL/min signify possible Chronic Kidney Disease.    Anion gap 7 5 - 15  Magnesium     Status: None   Collection Time: 12/30/14  4:58 AM  Result Value Ref Range   Magnesium 2.0 1.7 - 2.4 mg/dL  Phosphorus     Status: Abnormal   Collection Time: 12/30/14  4:58 AM  Result Value Ref Range   Phosphorus 4.9 (H) 2.5 - 4.6 mg/dL  Glucose, capillary     Status: None   Collection Time: 12/30/14  7:30 AM  Result Value Ref Range   Glucose-Capillary 96 65 - 99 mg/dL  Glucose, capillary     Status: Abnormal   Collection Time: 12/30/14 11:37 AM  Result Value Ref Range   Glucose-Capillary 110 (H) 65 - 99 mg/dL  No components found for: ESR, C REACTIVE PROTEIN MICRO: Results for orders placed or performed during the hospital encounter of 12/31/2014  Blood culture (routine x 2)     Status: None   Collection Time: 12/31/2014  2:44 PM  Result Value Ref Range Status   Specimen Description BLOOD  Final   Special Requests BLOOD  Final   Culture NO GROWTH 5 DAYS  Final   Report Status 12/27/2014 FINAL  Final  Blood culture (routine x 2)     Status: None   Collection Time: 12/24/2014  3:02 PM  Result Value Ref Range Status   Specimen Description BLOOD  Final   Special Requests BLOOD  Final   Culture NO GROWTH 5 DAYS  Final   Report Status 12/27/2014 FINAL  Final  Culture, blood (routine x 2)     Status: None (Preliminary result)   Collection Time: 12/27/14  8:58 AM  Result Value Ref Range Status   Specimen Description BLOOD  Final   Special Requests Normal  Final   Culture  Setup Time   Final    GRAM POSITIVE COCCI ANAEROBIC BOTTLE ONLY CRITICAL RESULT CALLED TO, READ BACK BY AND VERIFIED WITH: CALLED TO JULIE BRADDY AT 9518 ON 12/28/14/[facility] CONFIRMED BY [place]    Culture ENTEROCOCCUS FAECALIS ANAEROBIC BOTTLE ONLY   Final   Report Status PENDING  Incomplete   Organism ID, Bacteria ENTEROCOCCUS FAECALIS  Final       Susceptibility   Enterococcus faecalis - MIC*    AMPICILLIN <=2 SENSITIVE Sensitive     LINEZOLID 2 SENSITIVE Sensitive     * ENTEROCOCCUS FAECALIS  Culture, blood (routine x 2)     Status: None (Preliminary result)   Collection Time: 12/27/14  9:00 AM  Result Value Ref Range Status   Specimen Description BLOOD  Final   Special Requests Normal  Final   Culture NO GROWTH 2 DAYS  Final   Report Status PENDING  Incomplete  Culture, expectorated sputum-assessment     Status: None (Preliminary result)   Collection Time: 12/27/14 11:08 AM  Result Value Ref Range Status   Specimen Description SPUTUM  Final   Special Requests Normal  Final   Sputum evaluation THIS SPECIMEN IS ACCEPTABLE FOR SPUTUM CULTURE  Final   Report Status PENDING  Incomplete  Culture, respiratory (NON-Expectorated)     Status: None   Collection Time: 12/27/14 11:08 AM  Result Value Ref Range Status   Specimen Description SPUTUM  Final   Special Requests Normal Reflexed from [id number]  Final   Gram Stain   Final    FEW WBC SEEN FEW GRAM POSITIVE COCCI IN CLUSTERS GOOD SPECIMEN - 80-90% WBCS    Culture MODERATE GROWTH STAPHYLOCOCCUS AUREUS  Final   Report Status 12/30/2014 FINAL  Final   Organism ID, Bacteria STAPHYLOCOCCUS AUREUS  Final      Susceptibility   Staphylococcus aureus - MIC*    CIPROFLOXACIN <=0.5 SENSITIVE Sensitive     ERYTHROMYCIN <=0.25 SENSITIVE Sensitive     GENTAMICIN <=0.5 SENSITIVE Sensitive     OXACILLIN 0.5 SENSITIVE Sensitive     TETRACYCLINE <=1 SENSITIVE Sensitive     TRIMETH/SULFA <=10 SENSITIVE Sensitive     CLINDAMYCIN <=0.25 SENSITIVE Sensitive     CEFOXITIN SCREEN NEGATIVE Sensitive     Inducible Clindamycin NEGATIVE Sensitive     * MODERATE GROWTH STAPHYLOCOCCUS AUREUS    IMAGING: Dg Abd 1 View  12/29/2014   CLINICAL DATA:  Patient status post OG tube repositioning.  EXAM: ABDOMEN - 1 VIEW  COMPARISON:  12/29/2014  FINDINGS: Right femoral approach venous catheter is re-  demonstrated. Monitoring leads overlie the patient. NG tube tip projects over the left upper quadrant, likely within the stomach. Lumbar spine degenerative changes. Nonobstructed bowel gas pattern.  IMPRESSION: NG tube tip projects over the left upper quadrant, likely within the stomach.   Electronically Signed   By: Lovey Newcomer M.D.   On: 12/29/2014 17:11   Dg Abd Portable 1v  12/29/2014   CLINICAL DATA:  Check nasogastric catheter placement  EXAM: PORTABLE ABDOMEN - 1 VIEW  COMPARISON:  None.  FINDINGS: Nasogastric catheter is noted within the stomach. A venous catheter is noted via the right femoral approach. Degenerative changes of the lumbar spine are seen. A nonobstructive bowel gas pattern is noted.  IMPRESSION: Nasogastric catheter within the stomach.   Electronically Signed   By: Inez Catalina M.D.   On: 12/29/2014 16:05   Echo 5/16 - Left ventricle: Systolic function was normal. The estimated ejection fraction was in the range of 60% to 65%.  Assessment/Plan:  69 y.o. male admitted 5/15 with CVA, followed by a seizure, requiring intubation. He has developed PNA with MSSA on culture, as well as enterococcal bacteremia. He has a flexiseal in place and a central line. Remains intubated but not on any pressors. Has been febrile and with elevated WBC.  Likely fevers due to the enterococcal bacteremia as recent abx had not covered this.  MSSA PNA - started vanco in place of Clinda- has PCN allergy Will add ancef as this will provide better MSSA coverage  Enterococcal bacteremia- has line in place (placed 5/18 per report) - TTE neg but done 5./16 and bcx + from 5/20 Consider remove Central line

## 2014-12-30 NOTE — Care Management (Signed)
Off sedation but is not waking up.  Patient has had a significant decrease in secretions.  Anticipate attempts will be made to extubate

## 2014-12-30 NOTE — Progress Notes (Signed)
Subjective:   Remains critically ill uop 1100 cc last 24 hrs S cr 2.07  Objective:  Vital signs in last 24 hours:  Temp:  [99.2 F (37.3 C)-100 F (37.8 C)] 99.9 F (37.7 C) (05/23 0800) Pulse Rate:  [71-101] 93 (05/23 0800) Resp:  [0-16] 13 (05/23 0900) BP: (103-145)/(52-75) 140/60 mmHg (05/23 0900) SpO2:  [91 %-100 %] 98 % (05/23 0909) FiO2 (%):  [30 %] 30 % (05/23 0909) Weight:  [129.9 kg (286 lb 6 oz)] 129.9 kg (286 lb 6 oz) (05/23 0500)  Weight change: 2.9 kg (6 lb 6.3 oz) Filed Weights   12/28/14 0500 12/29/14 0500 12/30/14 0500  Weight: 126.8 kg (279 lb 8.7 oz) 127 kg (279 lb 15.8 oz) 129.9 kg (286 lb 6 oz)    Intake/Output: I/O last 3 completed shifts: In: 3793 [I.V.:2992.2; NG/GT:600.8; IV Piggyback:200] Out: 1925 [Urine:1525; Stool:400]     Physical Exam: General: Critically ill appearing  HEENT ETT in place  Neck No masses  Pulm/lungs Vent assisted  CVS/Heart No rub, tachycardic  Abdomen:  Sift, non tender  Extremities: + edema  Neurologic: sedated  Skin: No acute rashes   foley       Basic Metabolic Panel:  Recent Labs Lab 12/26/14 0457 12/27/14 0443  12/27/14 1805 12/28/14 0203 12/28/14 0517 12/28/14 1039 12/29/14 0515 12/30/14 0458  NA 135 134*  --  135 136 136 139 141 139  K 4.7 6.5*  < > 6.0* 5.9* 5.7* 5.1 4.5 3.8  CL 102 104  --  105 106 106 106 107 105  CO2 24 24  --  GLUCOSE 116* 108*  --  104* 119* 125* 142* 124* 114*  BUN 10 12  --  CREATININE 1.27* 1.61*  --  2.12* 2.03* 2.07* 1.86* 2.04* 2.07*  CALCIUM 8.3* 8.1*  --  8.0* 8.0* 8.0* 8.3* 8.0* 7.9*  MG 1.5* 2.1  --  2.2 2.1  --  2.1  --   --   PHOS 2.4* 5.9*  --  6.6* 6.2* 6.5* 6.1* 5.2*  --   < > = values in this interval not displayed.   CBC:  Recent Labs Lab 12/27/14 0443 12/28/14 0517 12/29/14 0515 12/30/14 0458  WBC 18.2* 13.2* 16.1* 17.6*  HGB 10.3* 8.8* 8.0* 7.3*  HCT 32.5* 28.2* 25.5* 23.5*  MCV 81.5 82.8 82.1 82.4   PLT 261 174 159 189      Microbiology: Results for orders placed or performed during the hospital encounter of 12/27/2014  Blood culture (routine x 2)     Status: None   Collection Time: 12/30/2014  2:44 PM  Result Value Ref Range Status   Specimen Description BLOOD  Final   Special Requests BLOOD  Final   Culture NO GROWTH 5 DAYS  Final   Report Status 12/27/2014 FINAL  Final  Blood culture (routine x 2)     Status: None   Collection Time: 12/12/2014  3:02 PM  Result Value Ref Range Status   Specimen Description BLOOD  Final   Special Requests BLOOD  Final   Culture NO GROWTH 5 DAYS  Final   Report Status 12/27/2014 FINAL  Final  Culture, blood (routine x 2)     Status: None (Preliminary result)   Collection Time: 12/27/14  8:58 AM  Result Value Ref Range Status   Specimen Description BLOOD  Final   Special Requests Normal  Final  Culture  Setup Time   Final    GRAM POSITIVE COCCI ANAEROBIC BOTTLE ONLY CRITICAL RESULT CALLED TO, READ BACK BY AND VERIFIED WITH: CALLED TO JULIE BRADDY AT 0250 ON 12/28/14/[facility] CONFIRMED BY [facility]    Culture ENTEROCOCCUS FAECALIS ANAEROBIC BOTTLE ONLY   Final   Report Status PENDING  Incomplete   Organism ID, Bacteria ENTEROCOCCUS FAECALIS  Final      Susceptibility   Enterococcus faecalis - MIC*    AMPICILLIN <=2 SENSITIVE Sensitive     LINEZOLID 2 SENSITIVE Sensitive     * ENTEROCOCCUS FAECALIS  Culture, blood (routine x 2)     Status: None (Preliminary result)   Collection Time: 12/27/14  9:00 AM  Result Value Ref Range Status   Specimen Description BLOOD  Final   Special Requests Normal  Final   Culture NO GROWTH 2 DAYS  Final   Report Status PENDING  Incomplete  Culture, expectorated sputum-assessment     Status: None (Preliminary result)   Collection Time: 12/27/14 11:08 AM  Result Value Ref Range Status   Specimen Description SPUTUM  Final   Special Requests Normal  Final   Sputum evaluation THIS SPECIMEN IS ACCEPTABLE FOR SPUTUM  CULTURE  Final   Report Status PENDING  Incomplete  Culture, respiratory (NON-Expectorated)     Status: None (Preliminary result)   Collection Time: 12/27/14 11:08 AM  Result Value Ref Range Status   Specimen Description SPUTUM  Final   Special Requests Normal Reflexed from [id number]  Final   Gram Stain   Final    FEW WBC SEEN FEW GRAM POSITIVE COCCI IN CLUSTERS GOOD SPECIMEN - 80-90% WBCS    Culture MODERATE GROWTH STAPHYLOCOCCUS AUREUS  Final   Report Status PENDING  Incomplete   Organism ID, Bacteria STAPHYLOCOCCUS AUREUS  Final      Susceptibility   Staphylococcus aureus - MIC*    CIPROFLOXACIN <=0.5 SENSITIVE Sensitive     ERYTHROMYCIN <=0.25 SENSITIVE Sensitive     GENTAMICIN <=0.5 SENSITIVE Sensitive     OXACILLIN 0.5 SENSITIVE Sensitive     TETRACYCLINE <=1 SENSITIVE Sensitive     TRIMETH/SULFA <=10 SENSITIVE Sensitive     CLINDAMYCIN <=0.25 SENSITIVE Sensitive     CEFOXITIN SCREEN NEGATIVE Sensitive     Inducible Clindamycin NEGATIVE Sensitive     * MODERATE GROWTH STAPHYLOCOCCUS AUREUS    Coagulation Studies: No results for input(s): LABPROT, INR in the last 72 hours.  Urinalysis: No results for input(s): COLORURINE, LABSPEC, PHURINE, GLUCOSEU, HGBUR, BILIRUBINUR, KETONESUR, PROTEINUR, UROBILINOGEN, NITRITE, LEUKOCYTESUR in the last 72 hours.  Invalid input(s): APPERANCEUR    Imaging: Dg Abd 1 View  12/29/2014   CLINICAL DATA:  Patient status post OG tube repositioning.  EXAM: ABDOMEN - 1 VIEW  COMPARISON:  12/29/2014  FINDINGS: Right femoral approach venous catheter is re- demonstrated. Monitoring leads overlie the patient. NG tube tip projects over the left upper quadrant, likely within the stomach. Lumbar spine degenerative changes. Nonobstructed bowel gas pattern.  IMPRESSION: NG tube tip projects over the left upper quadrant, likely within the stomach.   Electronically Signed   By: Annia Belt M.D.   On: 12/29/2014 17:11   Dg Abd Portable 1v  12/29/2014    CLINICAL DATA:  Check nasogastric catheter placement  EXAM: PORTABLE ABDOMEN - 1 VIEW  COMPARISON:  None.  FINDINGS: Nasogastric catheter is noted within the stomach. A venous catheter is noted via the right femoral approach. Degenerative changes of the lumbar spine are seen. A nonobstructive bowel  gas pattern is noted.  IMPRESSION: Nasogastric catheter within the stomach.   Electronically Signed   By: Alcide CleverMark  Lukens M.D.   On: 12/29/2014 16:05     Medications:   . feeding supplement (NEPRO CARB STEADY) 1,000 mL (12/29/14 1800)  . fentaNYL infusion INTRAVENOUS 100 mcg/hr (12/30/14 0900)  . propofol (DIPRIVAN) infusion Stopped (12/30/14 0900)  . banana bag IV 1000 mL 40 mL/hr at 12/30/14 0609   . albuterol  2.5 mg Nebulization Q6H  . aspirin  81 mg Oral Daily  . atorvastatin  80 mg Oral q1800  . chlorhexidine  15 mL Mouth/Throat BID  . clindamycin (CLEOCIN) IV  600 mg Intravenous 3 times per day  . famotidine (PEPCID) IV  20 mg Intravenous Q24H  . free water  25 mL Per Tube 6 times per day  . heparin  5,000 Units Subcutaneous 3 times per day  . metoprolol  5 mg Intravenous 4 times per day  . sodium chloride  3 mL Intravenous Q12H   acetaminophen **OR** acetaminophen, heparin, hydrALAZINE, midazolam, [DISCONTINUED] ondansetron **OR** ondansetron (ZOFRAN) IV  Assessment/ Plan:  69 y.o. male with hypertension, hyperlipidemia, osteoarthritis, gout was admitted to [facility] on 2014-10-26 with right midbrain CVA.   1. Acute renal failure secondary to acute tubular necrosis: placed on CRRT from 5/20 to 5/21. With hyperkalemia now resolved. - nonoliguric urine output now.  - UOP satisfactory - Electrolytes and Volume status are acceptable No acute indication for Dialysis at present   2. Acute respiratory failure. Secondary to his acute CVA. Intubated for airway protection.  - Concern for aspiration pneumonia       LOS: 8 Jeydi Klingel 5/23/20169:24 AM

## 2014-12-30 NOTE — Progress Notes (Signed)
[facility] [place] [facility]   PATIENT NAME: Lucas Ortega    MR#:  [id number]  DATE OF BIRTH:  1946-04-16  SUBJECTIVE:  propofol discontinued for several hours, still very sedated   REVIEW OF SYSTEMS:  Unable to perform due to sedation  ROS DRUG ALLERGIES:   Allergies  Allergen Reactions  . Penicillins Other (See Comments)    Unknown reaction    VITALS:  Blood pressure 166/100, pulse 107, temperature 99.9 F (37.7 C), temperature source Oral, resp. rate 22, height 5\' 6"  (1.676 m), weight 129.9 kg (286 lb 6 oz), SpO2 97 %.  PHYSICAL EXAMINATION:   Physical Exam  Constitutional: He is well-developed, well-nourished, and in no distress. No distress.  HENT:  Head: Normocephalic and atraumatic.  Neck: Normal range of motion. Neck supple. No JVD present.  Cardiovascular: Normal rate, regular rhythm and normal heart sounds.   No murmur heard. Pulmonary/Chest: No respiratory distress. He has no wheezes.  ET tube in place, vent support.  Abdominal: Bowel sounds are normal. He exhibits no distension and no mass. There is no tenderness.  Neurological:  Sedated on vent support.  Skin: Skin is warm. No rash noted. He is not diaphoretic.    LABORATORY PANEL:   CBC  Recent Labs Lab 12/30/14 0458  WBC 17.6*  HGB 7.3*  HCT 23.5*  PLT 189   ------------------------------------------------------------------------------------------------------------------  Chemistries   Recent Labs Lab 12/30/14 0458  NA 139  K 3.8  CL 105  CO2 27  GLUCOSE 114*  BUN 16  CREATININE 2.07*  CALCIUM 7.9*  MG 2.0   ------------------------------------------------------------------------------------------------------------------  Cardiac Enzymes No results for input(s): TROPONINI in the last 168 hours. ------------------------------------------------------------------------------------------------------------------  RADIOLOGY:  Dg Chest 2  View  12/08/2014   CLINICAL DATA:  Possible aspiration.  Abnormal lung sounds.  EXAM: CHEST  2 VIEW  COMPARISON:  None.  FINDINGS: The heart size and mediastinal contours are within normal limits. Both lungs are clear. No pneumothorax or pleural effusion is noted. The visualized skeletal structures are unremarkable.  IMPRESSION: No active cardiopulmonary disease.   Electronically Signed   By: Lupita RaiderJames  Green Jr, M.D.   On: 01/01/2015 15:02   Ct Head (brain) Wo Contrast  12/20/2014   IMPRESSION: Minimal diffuse cortical atrophy. Mild chronic ischemic white matter disease. No acute intracranial abnormality seen. These results were called by telephone at the time of interpretation on 01/02/2015 at 2:42 pm to Dr. Lenard LancePaduchowski, who verbally acknowledged these results.   Electronically Signed   By: Lupita RaiderJames  Green Jr, M.D.   On: 12/21/2014 14:44   Portable Chest 1 View  12/23/2014   IMPRESSION: Cardiomegaly  Calcified tortuous aorta  No obvious infiltrate, congestive heart failure or pneumothorax.  Evaluation limited by portable technique and patient's habitus.   Electronically Signed   By: Lacy DuverneySteven  Olson M.D.   On: 12/23/2014 07:31     ASSESSMENT AND PLAN:    This is 69 year old male with a history of hypertension and gout he was found by his family with left facial droop and left-sided weakness. He also has been drinking EtOH heavily. He had a seizure in the ER.  * sepsis - MSSA and enterococcus in blood cultures, MSSA in sputum culture  - continue clindamycin - Infectious disease consultation for further recommendations  * acute respiratory failure due to Aspiration pneumonia - appreciate pulmonology following - on full vent support - continue clindamycin  * Stroke:   - right pontine infarct. - appreciate neurology consultation -  continue ASA statin  * Seizure: patient had a witnessed seizure in the emergency department. - Keppra 500 mg for 3 days then stop, no further seizure activity  *  hyperkalemia  improved   CRRT per nephro on 12/27/14- stopped 5/21  *  EtOH dependence: sedation now.  * Essential hypertension: Patient is on lisinopril and Norvasc at home,   Here on hydralazine when necessary.  Goals of care: Appreciate palliative care consultation. Patient currently Full code.  Will need to make plan for possible extubation.  At this point not recovering neurologic function after sedation. EEG may be needed.  Management plans discussed with the patient's sister who is in agreement.  CODE STATUS FULL  TOTAL TIME TAKING CARE OF THIS PATIENT: 30 min critical care.    Elby Showers M.D on 12/30/2014 at 3:08 PM  Between 7am to 6pm - Pager - [phone] After 6pm go to [email or web address] - password [email or web address] [facility]  [facility]  Office  [phone]  CC: Primary care physician; Ashley Mariner, MD

## 2014-12-30 NOTE — Consult Note (Signed)
PULMONARY / CRITICAL CARE MEDICINE   Name: Lucas Ortega MRN: [id number] DOB: [birth year]    ADMISSION DATE:  12/09/2014   CHIEF COMPLAINT:   Acute CVA with inability to clear secretions   SUBJECTIVE   Patient remains intubated,sedated.  Blood cultures + E. Faecalis Sputum cultures + Satph aureaus  Plan for SAT/SBT today, fio2 down to 30%       PAST MEDICAL HISTORY    :  Past Medical History  Diagnosis Date  . Hypertension   . Hyperlipidemia   . Arthritis   . Gout    History reviewed. No pertinent past surgical history. Prior to Admission medications   Medication Sig Start Date End Date Taking? Authorizing Provider  amLODipine (NORVASC) 5 MG tablet Take 5 mg by mouth daily.   Yes Historical Provider, MD  colchicine 0.6 MG tablet Take 0.6 mg by mouth daily.   Yes Historical Provider, MD  lisinopril (PRINIVIL,ZESTRIL) 40 MG tablet Take 40 mg by mouth daily.   Yes Historical Provider, MD  Multiple Vitamins-Minerals (CENTRUM SILVER ADULT 50+) TABS Take 1 tablet by mouth daily.   Yes Historical Provider, MD  vitamin B-12 (CYANOCOBALAMIN) 1000 MCG tablet Take 1,000 mcg by mouth daily.   Yes Historical Provider, MD   Allergies  Allergen Reactions  . Penicillins Other (See Comments)    Unknown reaction     FAMILY HISTORY   Family History  Problem Relation Age of Onset  . Family history unknown: Yes      SOCIAL HISTORY    reports that he has never smoked. He does not have any smokeless tobacco history on file. He reports that he drinks alcohol. He reports that he does not use illicit drugs.  Review of Systems  Unable to perform ROS: critical illness      VITAL SIGNS    Temp:  [99.2 F (37.3 C)-100 F (37.8 C)] 99.9 F (37.7 C) (05/23 0800) Pulse Rate:  [71-101] 93 (05/23 0800) Resp:  [0-16] 13 (05/23 0900) BP: (103-142)/(52-75) 140/60 mmHg (05/23 0900) SpO2:  [91 %-100 %] 98 % (05/23 0909) FiO2 (%):  [30 %] 30 % (05/23 0909) Weight:  [286 lb 6 oz  (129.9 kg)] 286 lb 6 oz (129.9 kg) (05/23 0500) HEMODYNAMICS:   VENTILATOR SETTINGS: Vent Mode:  [-] PRVC FiO2 (%):  [30 %] 30 % Set Rate:  [16 bmp] 16 bmp Vt Set:  [500 mL] 500 mL PEEP:  [5 cmH20] 5 cmH20 INTAKE / OUTPUT:  Intake/Output Summary (Last 24 hours) at 12/30/14 1103 Last data filed at 12/30/14 0946  Gross per 24 hour  Intake 2668.5 ml  Output   1575 ml  Net 1093.5 ml       PHYSICAL EXAM   Physical Exam  Constitutional: He appears distressed.  Increased oral secretions  HENT:  Head: Normocephalic and atraumatic.  Eyes: Pupils are equal, round, and reactive to light. No scleral icterus.  Neck: Normal range of motion. Neck supple.  Cardiovascular: Normal rate and regular rhythm.   No murmur heard. Pulmonary/Chest: No respiratory distress. He has no wheezes. He has rales.  resp distress  Abdominal: Soft. He exhibits no distension. There is no tenderness.  Musculoskeletal: He exhibits no edema.  Neurological: He displays normal reflexes. Coordination normal.  Gcs<8T   Skin: Skin is warm. No rash noted. He is diaphoretic.       LABS   LABS:  CBC  Recent Labs Lab 12/28/14 0517 12/29/14 0515 12/30/14 0458  WBC 13.2* 16.1* 17.6*  HGB 8.8* 8.0* 7.3*  HCT 28.2* 25.5* 23.5*  PLT 174 159 189   Coag's  Recent Labs Lab 12/28/14 0517  APTT 36   BMET  Recent Labs Lab 12/28/14 1039 12/29/14 0515 12/30/14 0458  NA 139 141 139  K 5.1 4.5 3.8  CL 106 107 105  CO2 26 27 27   BUN 17 17 16   CREATININE 1.86* 2.04* 2.07*  GLUCOSE 142* 124* 114*   Electrolytes  Recent Labs Lab 12/27/14 1805 12/28/14 0203 12/28/14 0517 12/28/14 1039 12/29/14 0515 12/30/14 0458  CALCIUM 8.0* 8.0* 8.0* 8.3* 8.0* 7.9*  MG 2.2 2.1  --  2.1  --   --   PHOS 6.6* 6.2* 6.5* 6.1* 5.2*  --    Sepsis Markers No results for input(s): LATICACIDVEN, PROCALCITON, O2SATVEN in the last 168 hours. ABG  Recent Labs Lab 12/24/14 1140  PHART 7.44  PCO2ART 41   PO2ART 75*   Liver Enzymes  Recent Labs Lab 12/28/14 0517 12/28/14 1039 12/29/14 0515  ALBUMIN 2.3* 2.3* 2.2*   Cardiac Enzymes No results for input(s): TROPONINI, PROBNP in the last 168 hours. Glucose  Recent Labs Lab 12/29/14 1106 12/29/14 1809 12/29/14 2020 12/30/14 0003 12/30/14 0345 12/30/14 0730  GLUCAP 97 91 84 98 85 96     Recent Results (from the past 240 hour(s))  Blood culture (routine x 2)     Status: None   Collection Time: 03-Mar-2015  2:44 PM  Result Value Ref Range Status   Specimen Description BLOOD  Final   Special Requests BLOOD  Final   Culture NO GROWTH 5 DAYS  Final   Report Status 12/27/2014 FINAL  Final  Blood culture (routine x 2)     Status: None   Collection Time: 03-Mar-2015  3:02 PM  Result Value Ref Range Status   Specimen Description BLOOD  Final   Special Requests BLOOD  Final   Culture NO GROWTH 5 DAYS  Final   Report Status 12/27/2014 FINAL  Final  Culture, blood (routine x 2)     Status: None (Preliminary result)   Collection Time: 12/27/14  8:58 AM  Result Value Ref Range Status   Specimen Description BLOOD  Final   Special Requests Normal  Final   Culture  Setup Time   Final    GRAM POSITIVE COCCI ANAEROBIC BOTTLE ONLY CRITICAL RESULT CALLED TO, READ BACK BY AND VERIFIED WITH: CALLED TO JULIE BRADDY AT 0250 ON 12/28/14/RWW CONFIRMED BY PH    Culture ENTEROCOCCUS FAECALIS ANAEROBIC BOTTLE ONLY   Final   Report Status PENDING  Incomplete   Organism ID, Bacteria ENTEROCOCCUS FAECALIS  Final      Susceptibility   Enterococcus faecalis - MIC*    AMPICILLIN <=2 SENSITIVE Sensitive     LINEZOLID 2 SENSITIVE Sensitive     * ENTEROCOCCUS FAECALIS  Culture, blood (routine x 2)     Status: None (Preliminary result)   Collection Time: 12/27/14  9:00 AM  Result Value Ref Range Status   Specimen Description BLOOD  Final   Special Requests Normal  Final   Culture NO GROWTH 2 DAYS  Final   Report Status PENDING  Incomplete   Culture, expectorated sputum-assessment     Status: None (Preliminary result)   Collection Time: 12/27/14 11:08 AM  Result Value Ref Range Status   Specimen Description SPUTUM  Final   Special Requests Normal  Final   Sputum evaluation THIS SPECIMEN IS ACCEPTABLE FOR SPUTUM CULTURE  Final   Report Status  PENDING  Incomplete  Culture, respiratory (NON-Expectorated)     Status: None   Collection Time: 12/27/14 11:08 AM  Result Value Ref Range Status   Specimen Description SPUTUM  Final   Special Requests Normal Reflexed from [id number]  Final   Gram Stain   Final    FEW WBC SEEN FEW GRAM POSITIVE COCCI IN CLUSTERS GOOD SPECIMEN - 80-90% WBCS    Culture MODERATE GROWTH STAPHYLOCOCCUS AUREUS  Final   Report Status 12/30/2014 FINAL  Final   Organism ID, Bacteria STAPHYLOCOCCUS AUREUS  Final      Susceptibility   Staphylococcus aureus - MIC*    CIPROFLOXACIN <=0.5 SENSITIVE Sensitive     ERYTHROMYCIN <=0.25 SENSITIVE Sensitive     GENTAMICIN <=0.5 SENSITIVE Sensitive     OXACILLIN 0.5 SENSITIVE Sensitive     TETRACYCLINE <=1 SENSITIVE Sensitive     TRIMETH/SULFA <=10 SENSITIVE Sensitive     CLINDAMYCIN <=0.25 SENSITIVE Sensitive     CEFOXITIN SCREEN NEGATIVE Sensitive     Inducible Clindamycin NEGATIVE Sensitive     * MODERATE GROWTH STAPHYLOCOCCUS AUREUS      IMAGING    Dg Abd 1 View  12/29/2014   CLINICAL DATA:  Patient status post OG tube repositioning.  EXAM: ABDOMEN - 1 VIEW  COMPARISON:  12/29/2014  FINDINGS: Right femoral approach venous catheter is re- demonstrated. Monitoring leads overlie the patient. NG tube tip projects over the left upper quadrant, likely within the stomach. Lumbar spine degenerative changes. Nonobstructed bowel gas pattern.  IMPRESSION: NG tube tip projects over the left upper quadrant, likely within the stomach.   Electronically Signed   By: Annia Belt M.D.   On: 12/29/2014 17:11   Dg Abd Portable 1v  12/29/2014   CLINICAL DATA:  Check nasogastric  catheter placement  EXAM: PORTABLE ABDOMEN - 1 VIEW  COMPARISON:  None.  FINDINGS: Nasogastric catheter is noted within the stomach. A venous catheter is noted via the right femoral approach. Degenerative changes of the lumbar spine are seen. A nonobstructive bowel gas pattern is noted.  IMPRESSION: Nasogastric catheter within the stomach.   Electronically Signed   By: Alcide Clever M.D.   On: 12/29/2014 16:05        ASSESSMENT/PLAN   69 yo AAm admitted to ICU for acute CVA with inability to clear secretions intubated for resp failure. Acute DT's. With severe sepsis from E Faecalis bacteremia and Staph aureus pneumonia  PULMONARY-plan for SAT/SBT -Respiratory Failure -continue Full MV support -continue Bronchodilator Therapy -Wean Fio2 and PEEP as tolerated  CARDIOVASCULAR -needs ICU monitoring  RENAL -watch UO  GASTROINTESTINAL -Needs PEG tube for survival -on  OG tube feeds  HEMATOLOGIC -follow h/h  INFECTIOUS-Staph aureus ASPIRATION PNEUMONIA E faecalis bacteremia -consider ID consult  ENDOCRINE Follow FSBS  NEUROLOGIC encephalopathy from Dt's and acute CVA - CIWA protocol -bannana bag infusion -SAT/SBt today-wean sedatives  Palliative care team consulted, recommend DNR status and wean to extubate as some point    I have personally obtained a history, examined the patient, evaluated laboratory and imaging results, formulated the assessment and plan and placed orders.  The Patient requires high complexity decision making for assessment and support, frequent evaluation and titration of therapies, application of advanced monitoring technologies and extensive interpretation of multiple databases. Critical Care Time devoted to patient care services described in this note is 40 minutes.   Overall, patient is critically ill, prognosis is guarded. Patient at high risk for cardiac arrest and death.   Lucie Leather,  M.D. Pulmonary & Critical Care Medicine [facility] Medical Director Intensive Care Unit   12/30/2014, 11:03 AM

## 2014-12-30 NOTE — Progress Notes (Signed)
Pt remains on vent. Sedation off since 0900. Pt unresponsive. Pt has copious amounts of this white/clear secretions from ett and oral secretions. HR ST, BP elevated throughout day. Dr Belia Hemankasa consulted, no interventions ordered. Pt on continuous tube feed at 35 with q4 hour 25 water flush. Pt lungs: expiratory wheeze and diminished. Pt temp increased up to 102.2, administered prn dose of tylenol, temp gradually decreasing, now 100.9. Pt family at bedside throughout day.

## 2014-12-31 ENCOUNTER — Inpatient Hospital Stay: Payer: Medicare Other

## 2014-12-31 DIAGNOSIS — J9600 Acute respiratory failure, unspecified whether with hypoxia or hypercapnia: Secondary | ICD-10-CM

## 2014-12-31 DIAGNOSIS — R4182 Altered mental status, unspecified: Secondary | ICD-10-CM

## 2014-12-31 LAB — CBC
HEMATOCRIT: 20.9 % — AB (ref 40.0–52.0)
Hemoglobin: 6.7 g/dL — ABNORMAL LOW (ref 13.0–18.0)
MCH: 25.8 pg — ABNORMAL LOW (ref 26.0–34.0)
MCHC: 32 g/dL (ref 32.0–36.0)
MCV: 80.6 fL (ref 80.0–100.0)
PLATELETS: 211 10*3/uL (ref 150–440)
RBC: 2.6 MIL/uL — ABNORMAL LOW (ref 4.40–5.90)
RDW: 19.4 % — ABNORMAL HIGH (ref 11.5–14.5)
WBC: 17.4 10*3/uL — ABNORMAL HIGH (ref 3.8–10.6)

## 2014-12-31 LAB — BASIC METABOLIC PANEL
Anion gap: 7 (ref 5–15)
BUN: 17 mg/dL (ref 6–20)
CO2: 27 mmol/L (ref 22–32)
Calcium: 7.9 mg/dL — ABNORMAL LOW (ref 8.9–10.3)
Chloride: 106 mmol/L (ref 101–111)
Creatinine, Ser: 1.99 mg/dL — ABNORMAL HIGH (ref 0.61–1.24)
GFR calc Af Amer: 38 mL/min — ABNORMAL LOW (ref >60)
GFR calc non Af Amer: 33 mL/min — ABNORMAL LOW (ref >60)
Glucose, Bld: 123 mg/dL — ABNORMAL HIGH (ref 65–99)
Potassium: 3.3 mmol/L — ABNORMAL LOW (ref 3.5–5.1)
SODIUM: 140 mmol/L (ref 135–145)

## 2014-12-31 LAB — GLUCOSE, CAPILLARY
GLUCOSE-CAPILLARY: 104 mg/dL — AB (ref 65–99)
GLUCOSE-CAPILLARY: 102 mg/dL — AB (ref 65–99)
GLUCOSE-CAPILLARY: 102 mg/dL — AB (ref 65–99)
Glucose-Capillary: 108 mg/dL — ABNORMAL HIGH (ref 65–99)
Glucose-Capillary: 94 mg/dL (ref 65–99)

## 2014-12-31 LAB — MAGNESIUM: Magnesium: 2 mg/dL (ref 1.7–2.4)

## 2014-12-31 LAB — PHOSPHORUS: PHOSPHORUS: 3.8 mg/dL (ref 2.5–4.6)

## 2014-12-31 MED ORDER — PRO-STAT SUGAR FREE PO LIQD
30.0000 mL | Freq: Three times a day (TID) | ORAL | Status: DC
  Administered 2014-12-31 – 2015-01-02 (×8): 30 mL via ORAL

## 2014-12-31 MED ORDER — VITAL HIGH PROTEIN PO LIQD
1000.0000 mL | ORAL | Status: DC
  Administered 2014-12-31: 2000 mL

## 2014-12-31 MED ORDER — DEXMEDETOMIDINE HCL IN NACL 400 MCG/100ML IV SOLN
0.4000 ug/kg/h | INTRAVENOUS | Status: DC
Start: 2014-12-31 — End: 2015-01-03
  Administered 2014-12-31 (×2): 0.6 ug/kg/h via INTRAVENOUS
  Administered 2014-12-31 – 2015-01-01 (×3): 1 ug/kg/h via INTRAVENOUS
  Administered 2015-01-01: 0.8 ug/kg/h via INTRAVENOUS
  Administered 2015-01-01: 0.6 ug/kg/h via INTRAVENOUS
  Administered 2015-01-01: 1 ug/kg/h via INTRAVENOUS
  Administered 2015-01-02: 0.4 ug/kg/h via INTRAVENOUS
  Administered 2015-01-02: 0.5 ug/kg/h via INTRAVENOUS
  Administered 2015-01-02: 0.25 ug/kg/h via INTRAVENOUS
  Administered 2015-01-03: 0.9 ug/kg/h via INTRAVENOUS
  Administered 2015-01-03: 0.6 ug/kg/h via INTRAVENOUS
  Filled 2014-12-31 (×10): qty 100
  Filled 2014-12-31: qty 50
  Filled 2014-12-31 (×7): qty 100

## 2014-12-31 MED ORDER — POTASSIUM CHLORIDE 20 MEQ PO PACK
40.0000 meq | PACK | Freq: Once | ORAL | Status: AC
  Administered 2014-12-31: 40 meq
  Filled 2014-12-31: qty 2

## 2014-12-31 NOTE — Progress Notes (Signed)
Pt taken to Ct of Head today during shift per Dr Clovis FredricksonKasa's verbal orders due to changes to the left pupil. Dr. Belia HemanKasa notified by radiologist of Ct of Head Results given orders to maintain RAS -4, Dr Phifer notified family of Ct of Head results daughter Talbert ForestShirley stated she would arrive in the am 01/01/2015 to discuss plan of care

## 2014-12-31 NOTE — Consult Note (Signed)
PULMONARY / CRITICAL CARE MEDICINE   Name: Lucas Ortega MRN: [id number] DOB: 07-Nov-1945    ADMISSION DATE:  2014/12/26   CHIEF COMPLAINT:   Acute CVA with inability to clear secretions   SUBJECTIVE   Patient remains intubated,sedated.  Blood cultures + E. Faecalis Sputum cultures + Satph aureaus  Plan for SAT/SBT today, fio2 down to 24% Wean off sedation, assess for removal of central line       PAST MEDICAL HISTORY    :  Past Medical History  Diagnosis Date  . Hypertension   . Hyperlipidemia   . Arthritis   . Gout    History reviewed. No pertinent past surgical history. Prior to Admission medications   Medication Sig Start Date End Date Taking? Authorizing Provider  amLODipine (NORVASC) 5 MG tablet Take 5 mg by mouth daily.   Yes Historical Provider, MD  colchicine 0.6 MG tablet Take 0.6 mg by mouth daily.   Yes Historical Provider, MD  lisinopril (PRINIVIL,ZESTRIL) 40 MG tablet Take 40 mg by mouth daily.   Yes Historical Provider, MD  Multiple Vitamins-Minerals (CENTRUM SILVER ADULT 50+) TABS Take 1 tablet by mouth daily.   Yes Historical Provider, MD  vitamin B-12 (CYANOCOBALAMIN) 1000 MCG tablet Take 1,000 mcg by mouth daily.   Yes Historical Provider, MD   Allergies  Allergen Reactions  . Penicillins Other (See Comments)    Unknown reaction     FAMILY HISTORY   Family History  Problem Relation Age of Onset  . Family history unknown: Yes      SOCIAL HISTORY    reports that he has never smoked. He does not have any smokeless tobacco history on file. He reports that he drinks alcohol. He reports that he does not use illicit drugs.  Review of Systems  Unable to perform ROS: critical illness      VITAL SIGNS    Temp:  [98.7 F (37.1 C)-102.2 F (39 C)] 98.7 F (37.1 C) (05/24 0800) Pulse Rate:  [73-116] 78 (05/24 0700) Resp:  [16-26] 17 (05/24 0700) BP: (112-166)/(56-100) 120/73 mmHg (05/24 0700) SpO2:  [84 %-98 %] 95 % (05/24  0700) FiO2 (%):  [24 %-30 %] 24 % (05/24 0814) Weight:  [270 lb 11.6 oz (122.8 kg)] 270 lb 11.6 oz (122.8 kg) (05/24 0527) HEMODYNAMICS:   VENTILATOR SETTINGS: Vent Mode:  [-] PRVC FiO2 (%):  [24 %-30 %] 24 % Set Rate:  [16 bmp] 16 bmp Vt Set:  [500 mL] 500 mL PEEP:  [5 cmH20] 5 cmH20 Plateau Pressure:  [15 cmH20-44 cmH20] 44 cmH20 INTAKE / OUTPUT:  Intake/Output Summary (Last 24 hours) at 12/31/14 0924 Last data filed at 12/31/14 0800  Gross per 24 hour  Intake 3048.49 ml  Output   1675 ml  Net 1373.49 ml       PHYSICAL EXAM   Physical Exam  Constitutional: No distress.  Increased oral secretions  HENT:  Head: Normocephalic and atraumatic.  Eyes: Pupils are equal, round, and reactive to light. No scleral icterus.  Neck: Normal range of motion. Neck supple.  Cardiovascular: Normal rate and regular rhythm.   No murmur heard. Pulmonary/Chest: No respiratory distress. He has no wheezes. He has rales.  resp distress  Abdominal: Soft. He exhibits no distension. There is no tenderness.  Musculoskeletal: He exhibits no edema.  Neurological: He displays normal reflexes. Coordination normal.  Gcs<8T   Skin: Skin is warm. No rash noted. He is not diaphoretic.       LABS  LABS:  CBC  Recent Labs Lab 12/29/14 0515 12/30/14 0458 12/31/14 0515  WBC 16.1* 17.6* 17.4*  HGB 8.0* 7.3* 6.7*  HCT 25.5* 23.5* 20.9*  PLT 159 189 211   Coag's  Recent Labs Lab 12/28/14 0517  APTT 36   BMET  Recent Labs Lab 12/29/14 0515 12/30/14 0458 12/31/14 0515  NA 141 139 140  K 4.5 3.8 3.3*  CL 107 105 106  CO2 27 27 27   BUN 17 16 17   CREATININE 2.04* 2.07* 1.99*  GLUCOSE 124* 114* 123*   Electrolytes  Recent Labs Lab 12/28/14 1039 12/29/14 0515 12/30/14 0458 12/31/14 0515  CALCIUM 8.3* 8.0* 7.9* 7.9*  MG 2.1  --  2.0 2.0  PHOS 6.1* 5.2* 4.9* 3.8   Sepsis Markers No results for input(s): LATICACIDVEN, PROCALCITON, O2SATVEN in the last 168  hours. ABG  Recent Labs Lab 12/24/14 1140  PHART 7.44  PCO2ART 41  PO2ART 75*   Liver Enzymes  Recent Labs Lab 12/28/14 0517 12/28/14 1039 12/29/14 0515  ALBUMIN 2.3* 2.3* 2.2*   Cardiac Enzymes No results for input(s): TROPONINI, PROBNP in the last 168 hours. Glucose  Recent Labs Lab 12/30/14 1137 12/30/14 1646 12/30/14 2001 12/30/14 2356 12/31/14 0335 12/31/14 0741  GLUCAP 110* 120* 101* 108* 104* 102*     Recent Results (from the past 240 hour(s))  Blood culture (routine x 2)     Status: None   Collection Time: 12/11/2014  2:44 PM  Result Value Ref Range Status   Specimen Description BLOOD  Final   Special Requests BLOOD  Final   Culture NO GROWTH 5 DAYS  Final   Report Status 12/27/2014 FINAL  Final  Blood culture (routine x 2)     Status: None   Collection Time: 12/25/2014  3:02 PM  Result Value Ref Range Status   Specimen Description BLOOD  Final   Special Requests BLOOD  Final   Culture NO GROWTH 5 DAYS  Final   Report Status 12/27/2014 FINAL  Final  Culture, blood (routine x 2)     Status: None (Preliminary result)   Collection Time: 12/27/14  8:58 AM  Result Value Ref Range Status   Specimen Description BLOOD  Final   Special Requests Normal  Final   Culture  Setup Time   Final    GRAM POSITIVE COCCI ANAEROBIC BOTTLE ONLY CRITICAL RESULT CALLED TO, READ BACK BY AND VERIFIED WITH: CALLED TO JULIE BRADDY AT 0250 ON 12/28/14/RWW CONFIRMED BY PH    Culture ENTEROCOCCUS FAECALIS ANAEROBIC BOTTLE ONLY   Final   Report Status PENDING  Incomplete   Organism ID, Bacteria ENTEROCOCCUS FAECALIS  Final      Susceptibility   Enterococcus faecalis - MIC*    AMPICILLIN <=2 SENSITIVE Sensitive     LINEZOLID 2 SENSITIVE Sensitive     * ENTEROCOCCUS FAECALIS  Culture, blood (routine x 2)     Status: None (Preliminary result)   Collection Time: 12/27/14  9:00 AM  Result Value Ref Range Status   Specimen Description BLOOD  Final   Special Requests Normal   Final   Culture NO GROWTH 2 DAYS  Final   Report Status PENDING  Incomplete  Culture, expectorated sputum-assessment     Status: None (Preliminary result)   Collection Time: 12/27/14 11:08 AM  Result Value Ref Range Status   Specimen Description SPUTUM  Final   Special Requests Normal  Final   Sputum evaluation THIS SPECIMEN IS ACCEPTABLE FOR SPUTUM CULTURE  Final  Report Status PENDING  Incomplete  Culture, respiratory (NON-Expectorated)     Status: None   Collection Time: 12/27/14 11:08 AM  Result Value Ref Range Status   Specimen Description SPUTUM  Final   Special Requests Normal Reflexed from [id number]  Final   Gram Stain   Final    FEW WBC SEEN FEW GRAM POSITIVE COCCI IN CLUSTERS GOOD SPECIMEN - 80-90% WBCS    Culture MODERATE GROWTH STAPHYLOCOCCUS AUREUS  Final   Report Status 12/30/2014 FINAL  Final   Organism ID, Bacteria STAPHYLOCOCCUS AUREUS  Final      Susceptibility   Staphylococcus aureus - MIC*    CIPROFLOXACIN <=0.5 SENSITIVE Sensitive     ERYTHROMYCIN <=0.25 SENSITIVE Sensitive     GENTAMICIN <=0.5 SENSITIVE Sensitive     OXACILLIN 0.5 SENSITIVE Sensitive     TETRACYCLINE <=1 SENSITIVE Sensitive     TRIMETH/SULFA <=10 SENSITIVE Sensitive     CLINDAMYCIN <=0.25 SENSITIVE Sensitive     CEFOXITIN SCREEN NEGATIVE Sensitive     Inducible Clindamycin NEGATIVE Sensitive     * MODERATE GROWTH STAPHYLOCOCCUS AUREUS      IMAGING    No results found.      ASSESSMENT/PLAN   69 yo AAm admitted to ICU for acute CVA with inability to clear secretions intubated for resp failure. Acute DT's. With severe sepsis from E Faecalis bacteremia and Staph aureus pneumonia  PULMONARY-plan for SAT/SBT today -Respiratory Failure -continue Full MV support -continue Bronchodilator Therapy  CARDIOVASCULAR -needs ICU monitoring  RENAL -watch UO  GASTROINTESTINAL -Needs PEG tube for survival-will re-consult GI if needed -on  OG tube feeds  HEMATOLOGIC -follow  h/h  INFECTIOUS-Staph aureus/ASPIRATION PNEUMONIA E faecalis bacteremia -follow up ID consult  ENDOCRINE Follow FSBS  NEUROLOGIC encephalopathy from Dt's and acute CVA - CIWA protocol -bannana bag infusion -SAT/SBt today-wean sedatives  Palliative care team consulted, recommend DNR status and wean to extubate as some point Will need to discuss with family regarding PEG tube placement.      I have personally obtained a history, examined the patient, evaluated laboratory and imaging results, formulated the assessment and plan and placed orders.  The Patient requires high complexity decision making for assessment and support, frequent evaluation and titration of therapies, application of advanced monitoring technologies and extensive interpretation of multiple databases. Critical Care Time devoted to patient care services described in this note is 40 minutes.   Overall, patient is critically ill, prognosis is guarded. Patient at high risk for cardiac arrest and death.   Lucie Leather, M.D. Pulmonary & Critical Care Medicine [facility] Medical Director Intensive Care Unit   12/31/2014, 9:24 AM

## 2014-12-31 NOTE — Progress Notes (Signed)
PHARMACY - CRITICAL CARE PROGRESS NOTE  Pharmacy Consult for Vancomycin dosing, Electrolyte management and Constipation prevention  Indication: Bacteremia, ICU status requiring mechanical ventilation     Allergies  Allergen Reactions  . Penicillins Other (See Comments)    Unknown reaction    Patient Measurements: Height:  (167.6 cm) Weight: 270 lb 11.6 oz (122.8 kg) IBW/kg (Calculated) : 63.8   Vital Signs: Temp: 98.5 F (36.9 C) (05/24 1200) Temp Source: Axillary (05/24 1200) BP: 126/66 mmHg (05/24 1200) Pulse Rate: 70 (05/24 1200) Intake/Output from previous day: 05/23 0701 - 05/24 0700 In: 1373.5 [P.O.:160; I.V.:563.5; NG/GT:50; IV Piggyback:600] Out: 1550 [Urine:1475; Stool:75] Intake/Output from this shift: Total I/O In: 400 [NG/GT:50; IV Piggyback:350] Out: 275 [Urine:275] Vent settings for last 24 hours: Vent Mode:  [-] PRVC FiO2 (%):  [24 %] 24 % Set Rate:  [16 bmp] 16 bmp Vt Set:  [500 mL] 500 mL PEEP:  [5 cmH20] 5 cmH20 Plateau Pressure:  [15 cmH20-44 cmH20] 44 cmH20  Labs:  Recent Labs  12/29/14 0515 12/30/14 0458 12/31/14 0515  WBC 16.1* 17.6* 17.4*  HGB 8.0* 7.3* 6.7*  HCT 25.5* 23.5* 20.9*  PLT 159 189 211  CREATININE 2.04* 2.07* 1.99*  MG  --  2.0 2.0  PHOS 5.2* 4.9* 3.8  ALBUMIN 2.2*  --   --    Estimated Creatinine Clearance: 43.3 mL/min (by C-G formula based on Cr of 1.99).   Recent Labs  12/31/14 0335 12/31/14 0741 12/31/14 1208  GLUCAP 104* 102* 108*    Microbiology: Recent Results (from the past 720 hour(s))  Blood culture (routine x 2)     Status: None   Collection Time: 01/01/2015  2:44 PM  Result Value Ref Range Status   Specimen Description BLOOD  Final   Special Requests BLOOD  Final   Culture NO GROWTH 5 DAYS  Final   Report Status 12/27/2014 FINAL  Final  Blood culture (routine x 2)     Status: None   Collection Time: 12/29/2014  3:02 PM  Result Value Ref Range Status   Specimen Description BLOOD  Final    Special Requests BLOOD  Final   Culture NO GROWTH 5 DAYS  Final   Report Status 12/27/2014 FINAL  Final  Culture, blood (routine x 2)     Status: None (Preliminary result)   Collection Time: 12/27/14  8:58 AM  Result Value Ref Range Status   Specimen Description BLOOD  Final   Special Requests Normal  Final   Culture  Setup Time   Final    GRAM POSITIVE COCCI ANAEROBIC BOTTLE ONLY CRITICAL RESULT CALLED TO, READ BACK BY AND VERIFIED WITH: CALLED TO JULIE BRADDY AT 0250 ON 12/28/14/[facility] CONFIRMED BY PH    Culture ENTEROCOCCUS FAECALIS ANAEROBIC BOTTLE ONLY   Final   Report Status PENDING  Incomplete   Organism ID, Bacteria ENTEROCOCCUS FAECALIS  Final      Susceptibility   Enterococcus faecalis - MIC*    AMPICILLIN <=2 SENSITIVE Sensitive     LINEZOLID 2 SENSITIVE Sensitive     * ENTEROCOCCUS FAECALIS  Culture, blood (routine x 2)     Status: None (Preliminary result)   Collection Time: 12/27/14  9:00 AM  Result Value Ref Range Status   Specimen Description BLOOD  Final   Special Requests Normal  Final   Culture NO GROWTH 4 DAYS  Final   Report Status PENDING  Incomplete  Culture, expectorated sputum-assessment     Status: None (Preliminary result)  Collection Time: 12/27/14 11:08 AM  Result Value Ref Range Status   Specimen Description SPUTUM  Final   Special Requests Normal  Final   Sputum evaluation THIS SPECIMEN IS ACCEPTABLE FOR SPUTUM CULTURE  Final   Report Status PENDING  Incomplete  Culture, respiratory (NON-Expectorated)     Status: None   Collection Time: 12/27/14 11:08 AM  Result Value Ref Range Status   Specimen Description SPUTUM  Final   Special Requests Normal Reflexed from [id number]  Final   Gram Stain   Final    FEW WBC SEEN FEW GRAM POSITIVE COCCI IN CLUSTERS GOOD SPECIMEN - 80-90% WBCS    Culture MODERATE GROWTH STAPHYLOCOCCUS AUREUS  Final   Report Status 12/30/2014 FINAL  Final   Organism ID, Bacteria STAPHYLOCOCCUS AUREUS  Final      Susceptibility    Staphylococcus aureus - MIC*    CIPROFLOXACIN <=0.5 SENSITIVE Sensitive     ERYTHROMYCIN <=0.25 SENSITIVE Sensitive     GENTAMICIN <=0.5 SENSITIVE Sensitive     OXACILLIN 0.5 SENSITIVE Sensitive     TETRACYCLINE <=1 SENSITIVE Sensitive     TRIMETH/SULFA <=10 SENSITIVE Sensitive     CLINDAMYCIN <=0.25 SENSITIVE Sensitive     CEFOXITIN SCREEN NEGATIVE Sensitive     Inducible Clindamycin NEGATIVE Sensitive     * MODERATE GROWTH STAPHYLOCOCCUS AUREUS    Medications:  Scheduled:  . albuterol  2.5 mg Nebulization Q6H  . aspirin  81 mg Oral Daily  . atorvastatin  80 mg Oral q1800  .  ceFAZolin (ANCEF) IV  1 g Intravenous Q12H  . chlorhexidine  15 mL Mouth/Throat BID  . famotidine (PEPCID) IV  20 mg Intravenous Q24H  . feeding supplement (PRO-STAT SUGAR FREE 64)  30 mL Oral TID  . free water  25 mL Per Tube 6 times per day  . heparin  5,000 Units Subcutaneous 3 times per day  . metoprolol  5 mg Intravenous 4 times per day  . sodium chloride  3 mL Intravenous Q12H  . vancomycin  1,250 mg Intravenous Q18H   Infusions:  . dexmedetomidine 0.6 mcg/kg/hr (12/31/14 1342)  . feeding supplement (VITAL HIGH PROTEIN) 2,000 mL (12/31/14 1440)  . fentaNYL infusion INTRAVENOUS Stopped (12/30/14 1101)  . propofol (DIPRIVAN) infusion 49.986 mcg/kg/min (12/31/14 0900)  . banana bag IV 1000 mL 40 mL/hr at 12/31/14 1425    Assessment: 69 yo male critical care patient requiring mechanical ventilation. Plan:   1. Vancomycin Dosing: Patient transitioned to vancomycin for E.faecalis bacteremia. Will start patient on vancomycin 1250mg  IV Q18hr for goal trough of 15-20. Will obtain trough prior to 4th dose of vancomycin on 5/25. Patient also on cefazolin for MSSA in sputum.   2. Electrolytes -  Given KCl 40mEq PO x 1 today. Will continue to monitor with am labs.    3. Constipation - Patient had significant diarrhea over the weekend. Will continue holding docusate/senna.   Garlon HatchetJody Tamotsu Wiederholt,  PharmD Clinical Pharmacist 12/31/2014,2:50 PM

## 2014-12-31 NOTE — Progress Notes (Signed)
[facility] [place] [facility]   PATIENT NAME: Lucas Ortega    MR#:  [id number]  DATE OF BIRTH:  May 16, 1946  SUBJECTIVE:  Sedated on precedex and fentanyl   REVIEW OF SYSTEMS:  Unable to perform due to sedation  ROS DRUG ALLERGIES:   Allergies  Allergen Reactions  . Penicillins Other (See Comments)    Unknown reaction    VITALS:  Blood pressure 126/66, pulse 70, temperature 98.5 F (36.9 C), temperature source Axillary, resp. rate 20, height 5\' 6"  (1.676 m), weight 122.8 kg (270 lb 11.6 oz), SpO2 99 %.  PHYSICAL EXAMINATION:   Physical Exam  Constitutional: He is well-developed, well-nourished, and in no distress. No distress.  HENT:  Head: Normocephalic and atraumatic.  Neck: Normal range of motion. Neck supple. No JVD present.  Cardiovascular: Normal rate, regular rhythm and normal heart sounds.   No murmur heard. Pulmonary/Chest: No respiratory distress. He has no wheezes.  ET tube in place, vent support.  Abdominal: Bowel sounds are normal. He exhibits no distension and no mass. There is no tenderness.  Neurological:  Sedated on vent support.  Skin: Skin is warm. No rash noted. He is not diaphoretic.    LABORATORY PANEL:   CBC  Recent Labs Lab 12/31/14 0515  WBC 17.4*  HGB 6.7*  HCT 20.9*  PLT 211   ------------------------------------------------------------------------------------------------------------------  Chemistries   Recent Labs Lab 12/31/14 0515  NA 140  K 3.3*  CL 106  CO2 27  GLUCOSE 123*  BUN 17  CREATININE 1.99*  CALCIUM 7.9*  MG 2.0   ------------------------------------------------------------------------------------------------------------------  Cardiac Enzymes No results for input(s): TROPONINI in the last 168 hours. ------------------------------------------------------------------------------------------------------------------  RADIOLOGY:  Dg Chest 2 View  01-31-2015   CLINICAL  DATA:  Possible aspiration.  Abnormal lung sounds.  EXAM: CHEST  2 VIEW  COMPARISON:  None.  FINDINGS: The heart size and mediastinal contours are within normal limits. Both lungs are clear. No pneumothorax or pleural effusion is noted. The visualized skeletal structures are unremarkable.  IMPRESSION: No active cardiopulmonary disease.   Electronically Signed   By: Lupita RaiderJames  Green Jr, M.D.   On: 006-24-2016 15:02   Ct Head (brain) Wo Contrast  01-31-2015   IMPRESSION: Minimal diffuse cortical atrophy. Mild chronic ischemic white matter disease. No acute intracranial abnormality seen. These results were called by telephone at the time of interpretation on 01-31-2015 at 2:42 pm to Dr. Lenard LancePaduchowski, who verbally acknowledged these results.   Electronically Signed   By: Lupita RaiderJames  Green Jr, M.D.   On: 006-24-2016 14:44   Portable Chest 1 View  12/23/2014   IMPRESSION: Cardiomegaly  Calcified tortuous aorta  No obvious infiltrate, congestive heart failure or pneumothorax.  Evaluation limited by portable technique and patient's habitus.   Electronically Signed   By: Lacy DuverneySteven  Olson M.D.   On: 12/23/2014 07:31     ASSESSMENT AND PLAN:    This is 69 year old male with a history of hypertension and gout he was found by his family with left facial droop and left-sided weakness. He also has been drinking EtOH heavily. He had a seizure in the ER.  * sepsis - due to MSSA and enterococcal bacteremia, MSSA pneumonia - appreciate ID consultation, now on vanc and ancef - replace central line when possible - persistent leucocytosis  * acute respiratory failure due to Aspiration pneumonia - appreciate pulmonology following - on full vent support with plan for SBT at 8 am in morning tomorrow  * Stroke:   -  right pontine infarct. - appreciate neurology consultation - continue ASA statin  * Seizure: patient had a witnessed seizure in the emergency department - Keppra 500 mg for 3 days then stopped, no further seizure  activity  * hypokalemia- CCU replacement protocol  *  EtOH dependence: sedation now.  * Essential hypertension: Patient is on lisinopril and Norvasc at home,   Here on hydralazine when necessary.  * acute on CKD due to ATN baseline Cr is 1.2 - Cr stable around 2 - nephrology following  * anemia - in the setting of critical illness, renal failure - no bleeding noted - d/w Dr. Belia Heman, no transfusion planned  Goals of care: Appreciate palliative care consultation. Patient currently Full code.  Plan is for extubation in morning with re-intubation if needed.  Will then need to discuss trach/PEG  Management plans discussed with the patient's sister who is in agreement.  CODE STATUS FULL  TOTAL TIME TAKING CARE OF THIS PATIENT: 30 min critical care.    Elby Showers M.D on 12/31/2014 at 2:24 PM  Between 7am to 6pm - Pager - [phone] After 6pm go to [email or web address] - password [email or web address] [facility]  [facility]  Office  [phone]  CC: Primary care physician; Ashley Mariner, MD

## 2014-12-31 NOTE — Consult Note (Signed)
Pt not waking up despite sedation being stopped. Not moving L.side. Repeat head CT confirms new large R.MCA infarct. I updated pt's sister, Lucas Ortega. She will be here in AM and I will meet with her at that time to readdress goals of treatment in light of this new finding.

## 2014-12-31 NOTE — Progress Notes (Signed)
I informed Dr. Belia HemanKasa of patient's hemoglobin of 6.7 which has dropped from 7.3 on yesterday. He says to monitor hemoglobin for now. Also discussed patient's agitation and the need to increase propofol to 60 this morning. He says he plans to extubate patient today and to wean off propofol and start precedex gtt.

## 2014-12-31 NOTE — Progress Notes (Signed)
Subjective:   Patient remains critically ill Vent assisted. Fio2 24% UOP 1475 cc  Objective:  Vital signs in last 24 hours:  Temp:  [98.7 F (37.1 C)-102.2 F (39 C)] 98.7 F (37.1 C) (05/24 0800) Pulse Rate:  [73-116] 78 (05/24 0700) Resp:  [13-26] 17 (05/24 0700) BP: (112-166)/(56-100) 120/73 mmHg (05/24 0700) SpO2:  [84 %-98 %] 95 % (05/24 0700) FiO2 (%):  [24 %-30 %] 24 % (05/24 0814) Weight:  [122.8 kg (270 lb 11.6 oz)] 122.8 kg (270 lb 11.6 oz) (05/24 0527)  Weight change: -7.1 kg (-15 lb 10.4 oz) Filed Weights   12/29/14 0500 12/30/14 0500 12/31/14 0527  Weight: 127 kg (279 lb 15.8 oz) 129.9 kg (286 lb 6 oz) 122.8 kg (270 lb 11.6 oz)    Intake/Output: I/O last 3 completed shifts: In: 3169.5 [P.O.:160; I.V.:1964.2; NG/GT:445.3; IV Piggyback:600] Out: 2100 [Urine:2025; Stool:75]     Physical Exam: General: Critically ill appearing  HEENT ETT in place  Neck No masses  Pulm/lungs Vent assisted  CVS/Heart No rub, tachycardic  Abdomen:  Sift, non tender  Extremities: + dependent edema  Neurologic: sedated  Skin: No acute rashes   foley       Basic Metabolic Panel:  Recent Labs Lab 12/27/14 1805 12/28/14 0203 12/28/14 0517 12/28/14 1039 12/29/14 0515 12/30/14 0458 12/31/14 0515  NA 135 136 136 139 141 139 140  K 6.0* 5.9* 5.7* 5.1 4.5 3.8 3.3*  CL 105 106 106 106 107 105 106  CO2 GLUCOSE 104* 119* 125* 142* 124* 114* 123*  BUN CREATININE 2.12* 2.03* 2.07* 1.86* 2.04* 2.07* 1.99*  CALCIUM 8.0* 8.0* 8.0* 8.3* 8.0* 7.9* 7.9*  MG 2.2 2.1  --  2.1  --  2.0 2.0  PHOS 6.6* 6.2* 6.5* 6.1* 5.2* 4.9* 3.8     CBC:  Recent Labs Lab 12/27/14 0443 12/28/14 0517 12/29/14 0515 12/30/14 0458 12/31/14 0515  WBC 18.2* 13.2* 16.1* 17.6* 17.4*  HGB 10.3* 8.8* 8.0* 7.3* 6.7*  HCT 32.5* 28.2* 25.5* 23.5* 20.9*  MCV 81.5 82.8 82.1 82.4 80.6  PLT 261 174 159 189 211      Microbiology: Results for orders  placed or performed during the hospital encounter of 01/16/15  Blood culture (routine x 2)     Status: None   Collection Time: 16-Jan-2015  2:44 PM  Result Value Ref Range Status   Specimen Description BLOOD  Final   Special Requests BLOOD  Final   Culture NO GROWTH 5 DAYS  Final   Report Status 12/27/2014 FINAL  Final  Blood culture (routine x 2)     Status: None   Collection Time: 01/16/2015  3:02 PM  Result Value Ref Range Status   Specimen Description BLOOD  Final   Special Requests BLOOD  Final   Culture NO GROWTH 5 DAYS  Final   Report Status 12/27/2014 FINAL  Final  Culture, blood (routine x 2)     Status: None (Preliminary result)   Collection Time: 12/27/14  8:58 AM  Result Value Ref Range Status   Specimen Description BLOOD  Final   Special Requests Normal  Final   Culture  Setup Time   Final    GRAM POSITIVE COCCI ANAEROBIC BOTTLE ONLY CRITICAL RESULT CALLED TO, READ BACK BY AND VERIFIED WITH: CALLED TO JULIE BRADDY AT 0250 ON 12/28/14/[facility]WW CONFIRMED BY PH    Culture ENTEROCOCCUS FAECALIS ANAEROBIC BOTTLE ONLY  Final   Report Status PENDING  Incomplete   Organism ID, Bacteria ENTEROCOCCUS FAECALIS  Final      Susceptibility   Enterococcus faecalis - MIC*    AMPICILLIN <=2 SENSITIVE Sensitive     LINEZOLID 2 SENSITIVE Sensitive     * ENTEROCOCCUS FAECALIS  Culture, blood (routine x 2)     Status: None (Preliminary result)   Collection Time: 12/27/14  9:00 AM  Result Value Ref Range Status   Specimen Description BLOOD  Final   Special Requests Normal  Final   Culture NO GROWTH 2 DAYS  Final   Report Status PENDING  Incomplete  Culture, expectorated sputum-assessment     Status: None (Preliminary result)   Collection Time: 12/27/14 11:08 AM  Result Value Ref Range Status   Specimen Description SPUTUM  Final   Special Requests Normal  Final   Sputum evaluation THIS SPECIMEN IS ACCEPTABLE FOR SPUTUM CULTURE  Final   Report Status PENDING  Incomplete  Culture,  respiratory (NON-Expectorated)     Status: None   Collection Time: 12/27/14 11:08 AM  Result Value Ref Range Status   Specimen Description SPUTUM  Final   Special Requests Normal Reflexed from [id number]  Final   Gram Stain   Final    FEW WBC SEEN FEW GRAM POSITIVE COCCI IN CLUSTERS GOOD SPECIMEN - 80-90% WBCS    Culture MODERATE GROWTH STAPHYLOCOCCUS AUREUS  Final   Report Status 12/30/2014 FINAL  Final   Organism ID, Bacteria STAPHYLOCOCCUS AUREUS  Final      Susceptibility   Staphylococcus aureus - MIC*    CIPROFLOXACIN <=0.5 SENSITIVE Sensitive     ERYTHROMYCIN <=0.25 SENSITIVE Sensitive     GENTAMICIN <=0.5 SENSITIVE Sensitive     OXACILLIN 0.5 SENSITIVE Sensitive     TETRACYCLINE <=1 SENSITIVE Sensitive     TRIMETH/SULFA <=10 SENSITIVE Sensitive     CLINDAMYCIN <=0.25 SENSITIVE Sensitive     CEFOXITIN SCREEN NEGATIVE Sensitive     Inducible Clindamycin NEGATIVE Sensitive     * MODERATE GROWTH STAPHYLOCOCCUS AUREUS    Coagulation Studies: No results for input(s): LABPROT, INR in the last 72 hours.  Urinalysis: No results for input(s): COLORURINE, LABSPEC, PHURINE, GLUCOSEU, HGBUR, BILIRUBINUR, KETONESUR, PROTEINUR, UROBILINOGEN, NITRITE, LEUKOCYTESUR in the last 72 hours.  Invalid input(s): APPERANCEUR    Imaging: Dg Abd 1 View  12/29/2014   CLINICAL DATA:  Patient status post OG tube repositioning.  EXAM: ABDOMEN - 1 VIEW  COMPARISON:  12/29/2014  FINDINGS: Right femoral approach venous catheter is re- demonstrated. Monitoring leads overlie the patient. NG tube tip projects over the left upper quadrant, likely within the stomach. Lumbar spine degenerative changes. Nonobstructed bowel gas pattern.  IMPRESSION: NG tube tip projects over the left upper quadrant, likely within the stomach.   Electronically Signed   By: Annia Belt M.D.   On: 12/29/2014 17:11   Dg Abd Portable 1v  12/29/2014   CLINICAL DATA:  Check nasogastric catheter placement  EXAM: PORTABLE ABDOMEN - 1 VIEW   COMPARISON:  None.  FINDINGS: Nasogastric catheter is noted within the stomach. A venous catheter is noted via the right femoral approach. Degenerative changes of the lumbar spine are seen. A nonobstructive bowel gas pattern is noted.  IMPRESSION: Nasogastric catheter within the stomach.   Electronically Signed   By: Alcide Clever M.D.   On: 12/29/2014 16:05     Medications:   . feeding supplement (NEPRO CARB STEADY) 1,000 mL (12/31/14 0600)  . fentaNYL infusion INTRAVENOUS  Stopped (12/30/14 1101)  . propofol (DIPRIVAN) infusion 60 mcg/kg/min (12/31/14 0846)  . banana bag IV 1000 mL 40 mL/hr at 12/31/14 0500   . albuterol  2.5 mg Nebulization Q6H  . aspirin  81 mg Oral Daily  . atorvastatin  80 mg Oral q1800  .  ceFAZolin (ANCEF) IV  1 g Intravenous Q12H  . chlorhexidine  15 mL Mouth/Throat BID  . famotidine (PEPCID) IV  20 mg Intravenous Q24H  . free water  25 mL Per Tube 6 times per day  . heparin  5,000 Units Subcutaneous 3 times per day  . metoprolol  5 mg Intravenous 4 times per day  . potassium chloride  40 mEq Per Tube Once  . sodium chloride  3 mL Intravenous Q12H  . vancomycin  1,250 mg Intravenous Q18H   acetaminophen **OR** acetaminophen, heparin, hydrALAZINE, midazolam, [DISCONTINUED] ondansetron **OR** ondansetron (ZOFRAN) IV  Assessment/ Plan:  69 y.o. male with hypertension, hyperlipidemia, osteoarthritis, gout was admitted to [facility] on 03/19/2015 with right midbrain CVA.   1. Acute renal failure secondary to acute tubular necrosis: placed on CRRT from 5/20 to 5/21. With hyperkalemia now resolved. Baseline Cr 1.27 cc - nonoliguric urine output now. 1475 cc - UOP satisfactory - Electrolytes and Volume status are acceptable - No acute indication for Dialysis at present   2. Acute respiratory failure. Secondary to his acute CVA. Intubated for airway protection.  - Concern for aspiration pneumonia       LOS: 9 Lucas Ortega 5/24/20168:58 AM

## 2014-12-31 NOTE — Care Management Note (Signed)
Case Management Note  Patient Details  Name: Cristie HemLester Cardarelli MRN: [id number] Date of Birth: 04-Nov-1945  Dr Phifer discussed DNR status with family. Remains a full code at this time. Hgb=6.7 today. Tube feedings stopped. Blood culture = sepsis. Infectious Disease Consult ordered.                       Expected Discharge Date:                  Expected Discharge Plan:     In-House Referral:     Discharge planning Services     Post Acute Care Choice:    Choice offered to:     DME Arranged:    DME Agency:     HH Arranged:    HH Agency:     Status of Service:     Medicare Important Message Given:    Date Medicare IM Given:    Medicare IM give by:    Date Additional Medicare IM Given:    Additional Medicare Important Message give by:     If discussed at Long Length of Stay Meetings, dates discussed:    Additional Comments:  Sarina Robleto A, RN 12/31/2014, 2:17 PM

## 2014-12-31 NOTE — Consult Note (Signed)
I met with pt's sister, Enid Derry, who is pt's Marine scientist. Also present in the meeting were pt's daughter, brother and another sister. I updated them on pt's current condition and plan for SBT. We discussed reintubation in the event pt declines after extubation. They understand that reintubation would mean trach/PEG/LTACH. Family all admit that pt would not want such aggressive care but they are struggling with decision and cannot agree with DNI at this time. Pt remains a full code.

## 2014-12-31 NOTE — Progress Notes (Signed)
Nutrition Follow-up  DOCUMENTATION CODES:     INTERVENTION:   (Coordination of care: Recommend switching to vital high protein goal rate of  6645ml/hr with prostat times 3. Provides 1380 kcals and 140 gm of protein (meeting 100% of kcal and protein needs).)  NUTRITION DIAGNOSIS:  Inadequate oral intake related to inability to eat as evidenced by NPO status being addressed with tube feeding.    GOAL:   (Goal would be for pt to tolerate tube feeding at goal rate)    MONITOR:   (EN, Electrolyte and Renal Profile, Digestive system, Pulmonary Profile)  REASON FOR ASSESSMENT:   (follow-up) Enteral/tube feeding initiation and management  ASSESSMENT:  Pt remains on vent, planning weaning trial again today.  Sedation off at this time.   Electrolyte and Renal Profile:    Recent Labs Lab 12/28/14 1039 12/29/14 0515 12/30/14 0458 12/31/14 0515  BUN 17 17 16 17   CREATININE 1.86* 2.04* 2.07* 1.99*  NA 139 141 139 140  K 5.1 4.5 3.8 3.3*  MG 2.1  --  2.0 2.0  PHOS 6.1* 5.2* 4.9* 3.8   Medications: no pressors  Height:  Ht Readings from Last 1 Encounters:  01/06/2015 5\' 6"  (1.676 m)    Weight:  Wt Readings from Last 1 Encounters:  12/31/14 270 lb 11.6 oz (122.8 kg)        Wt Readings from Last 10 Encounters:  12/31/14 270 lb 11.6 oz (122.8 kg)  12/23/14 270 lb (122.471 kg)    BMI:  Body mass index is 43.72 kg/(m^2).  Estimated Nutritional Needs:  Kcal:  1298-1652kcals, (11-14kcals/kg) using acutal body weight of 118kg  Protein:  129-161g protein (2.0-2.5g/kg) using IBW of 64.5kg  Fluid:  1613-197735mL of fluid (25-4530mL/kg) using IBW of 64.5kg  Skin:  Reviewed, no issues  Diet Order:  Diet NPO time specified  EDUCATION NEEDS:  No education needs identified at this time   Intake/Output Summary (Last 24 hours) at 12/31/14 1248 Last data filed at 12/31/14 1233  Gross per 24 hour  Intake   1358 ml  Output   1400 ml  Net    -42 ml    Last BM:   Rectal tube in place noted minimal drainage output 100ml, possible removal today  HIGH Care Level Arisbeth Purrington B. Freida BusmanAllen, RD, LDN [phone] (pager)

## 2014-12-31 NOTE — Consult Note (Signed)
Palliative Medicine Inpatient Consult Follow Up Note   Name: Lucas Ortega Date: 12/31/2014 MRN: [id number]  DOB: 07-15-1946  Referring Physician: Gale Journeyatherine P Walsh, MD  Palliative Care consult requested for this 69 y.o. male for goals of medical therapy in patient with HTN, HLD, DM, ETOH abuse admitted with altered mental status now with VDRF  Mr Lucas Ortega is intubated, sedation being lightened in anticipation of weaning trial. Family at bedside.    REVIEW OF SYSTEMS:  Patient is not able to provide ROS  CODE STATUS: Full code   PAST MEDICAL HISTORY: Past Medical History  Diagnosis Date  . Hypertension   . Hyperlipidemia   . Arthritis   . Gout     PAST SURGICAL HISTORY: History reviewed. No pertinent past surgical history.  Vital Signs: BP 120/73 mmHg  Pulse 78  Temp(Src) 98.7 F (37.1 C) (Axillary)  Resp 17  Ht 5\' 6"  (1.676 m)  Wt 122.8 kg (270 lb 11.6 oz)  BMI 43.72 kg/m2  SpO2 95% Filed Weights   12/29/14 0500 12/30/14 0500 12/31/14 0527  Weight: 127 kg (279 lb 15.8 oz) 129.9 kg (286 lb 6 oz) 122.8 kg (270 lb 11.6 oz)    Estimated body mass index is 43.72 kg/(m^2) as calculated from the following:   Height as of this encounter: 5\' 6"  (1.676 m).   Weight as of this encounter: 122.8 kg (270 lb 11.6 oz).  PHYSICAL EXAM: General: Critically ill appearing HEENT: ETT/OGT in place Neck: Trachea midline  Cardiovascular: regular rate and rhythm Pulmonary/Chest: Poor air movemnt ant fields, no audible wheeze Abdominal: Soft, nontender, hypoactive bowel sounds GU: Foley present, clear yellow urine Extremities: + edema BLE's Neurological: unable to assess Skin: no rashes Psychiatric: sedated LABS: CBC:    Component Value Date/Time   WBC 17.4* 12/31/2014 0515   HGB 6.7* 12/31/2014 0515   HCT 20.9* 12/31/2014 0515   PLT 211 12/31/2014 0515   MCV 80.6 12/31/2014 0515   NEUTROABS 23.5* 12/14/2014 1444   LYMPHSABS 1.9 12/12/2014 1444   MONOABS 2.2* 12/24/2014 1444    EOSABS 0.0 01/04/2015 1444   BASOSABS 0.0 12/21/2014 1444   Comprehensive Metabolic Panel:    Component Value Date/Time   NA 140 12/31/2014 0515   K 3.3* 12/31/2014 0515   CL 106 12/31/2014 0515   CO2 27 12/31/2014 0515   BUN 17 12/31/2014 0515   CREATININE 1.99* 12/31/2014 0515   GLUCOSE 123* 12/31/2014 0515   CALCIUM 7.9* 12/31/2014 0515   AST 107* 12/23/2014 0408   ALT 29 12/23/2014 0408   ALKPHOS 67 12/23/2014 0408   BILITOT 1.4* 12/23/2014 0408   PROT 6.5 12/23/2014 0408   ALBUMIN 2.2* 12/29/2014 0515    IMPRESSION: [name] is a 69 y.o. Man with HTN, HLD, DM, gout, OA and ongoing ETOH abuse. He was admitted 12/10/2014 with altered mental status and L facial droop.MRI shows R pontine stroke.Hospital course complicated by VDRF. Plan is for SBT today with possible extubation.   I spoke with pt's sister/decision-maker, Lucas Ortega, by phone. She will come to hospital for meeting this AM for family meeting.   PLAN: Family meeting this AM   More than 50% of the visit was spent in counseling/coordination of care: YES  Time spent: 35 minutes

## 2014-12-31 NOTE — Progress Notes (Signed)
Date of Admission:  Jan 21, 2015     ID: Lucas Ortega is a 69 y.o. male with  Recent CVA, sz, etoh abuse s/p intubation with enterococcal bacteremia and mssa pna  Active Problems:   CVA (cerebral infarction)   Seizure disorder   CVA (cerebral vascular accident)   Aspiration into airway    Subjective: Getting repeat CT for abnml gaze. Fevers seem to be decreasing  Medications:  . albuterol  2.5 mg Nebulization Q6H  . aspirin  81 mg Oral Daily  . atorvastatin  80 mg Oral q1800  .  ceFAZolin (ANCEF) IV  1 g Intravenous Q12H  . chlorhexidine  15 mL Mouth/Throat BID  . famotidine (PEPCID) IV  20 mg Intravenous Q24H  . feeding supplement (PRO-STAT SUGAR FREE 64)  30 mL Oral TID  . free water  25 mL Per Tube 6 times per day  . heparin  5,000 Units Subcutaneous 3 times per day  . metoprolol  5 mg Intravenous 4 times per day  . sodium chloride  3 mL Intravenous Q12H  . vancomycin  1,250 mg Intravenous Q18H    Objective: Vital signs in last 24 hours: Temp:  [98.5 F (36.9 C)-100.2 F (37.9 C)] 98.5 F (36.9 C) (05/24 1200) Pulse Rate:  [70-102] 70 (05/24 1200) Resp:  [16-21] 20 (05/24 1100) BP: (111-149)/(54-77) 126/66 mmHg (05/24 1200) SpO2:  [84 %-99 %] 99 % (05/24 1412) FiO2 (%):  [24 %] 24 % (05/24 1543) Weight:  [122.8 kg (270 lb 11.6 oz)] 122.8 kg (270 lb 11.6 oz) (05/24 0527)  Constitutional: He is critically ill appearing HENT:  ETT in place Neck: Normal range of motion. Neck supple. No JVD present.  Cardiovascular: Normal rate, regular rhythm and normal heart sounds.  No murmur heard. Pulmonary/Chest: bil rhonchi ET tube in place, vent support.  Abdominal: Bowel sounds are normal. He exhibits no distension and no mass. There is no tenderness.  Neurological:  eye deviation to L, L eye is also down and out Sedated on vent support.  Foley, rectal tube in place Neck line on L- no redness Skin: Skin is warm. No rash noted. He is not diaphoretic  Lab  Results  Recent Labs  12/30/14 0458 12/31/14 0515  WBC 17.6* 17.4*  HGB 7.3* 6.7*  HCT 23.5* 20.9*  NA 139 140  K 3.8 3.3*  CL 105 106  CO2 27 27  BUN 16 17  CREATININE 2.07* 1.99*   Liver Panel  Recent Labs  12/29/14 0515  ALBUMIN 2.2*   Microbiology: Recent Results (from the past 240 hour(s))  Blood culture (routine x 2)     Status: None   Collection Time: January 21, 2015  2:44 PM  Result Value Ref Range Status   Specimen Description BLOOD  Final   Special Requests BLOOD  Final   Culture NO GROWTH 5 DAYS  Final   Report Status 12/27/2014 FINAL  Final  Blood culture (routine x 2)     Status: None   Collection Time: 01/21/15  3:02 PM  Result Value Ref Range Status   Specimen Description BLOOD  Final   Special Requests BLOOD  Final   Culture NO GROWTH 5 DAYS  Final   Report Status 12/27/2014 FINAL  Final  Culture, blood (routine x 2)     Status: None (Preliminary result)   Collection Time: 12/27/14  8:58 AM  Result Value Ref Range Status   Specimen Description BLOOD  Final   Special Requests Normal  Final  Culture  Setup Time   Final    GRAM POSITIVE COCCI ANAEROBIC BOTTLE ONLY CRITICAL RESULT CALLED TO, READ BACK BY AND VERIFIED WITH: CALLED TO JULIE BRADDY AT 0250 ON 12/28/14/[facility] CONFIRMED BY PH    Culture ENTEROCOCCUS FAECALIS ANAEROBIC BOTTLE ONLY   Final   Report Status PENDING  Incomplete   Organism ID, Bacteria ENTEROCOCCUS FAECALIS  Final      Susceptibility   Enterococcus faecalis - MIC*    AMPICILLIN <=2 SENSITIVE Sensitive     LINEZOLID 2 SENSITIVE Sensitive     * ENTEROCOCCUS FAECALIS  Culture, blood (routine x 2)     Status: None (Preliminary result)   Collection Time: 12/27/14  9:00 AM  Result Value Ref Range Status   Specimen Description BLOOD  Final   Special Requests Normal  Final   Culture NO GROWTH 4 DAYS  Final   Report Status PENDING  Incomplete  Culture, expectorated sputum-assessment     Status: None (Preliminary result)   Collection  Time: 12/27/14 11:08 AM  Result Value Ref Range Status   Specimen Description SPUTUM  Final   Special Requests Normal  Final   Sputum evaluation THIS SPECIMEN IS ACCEPTABLE FOR SPUTUM CULTURE  Final   Report Status PENDING  Incomplete  Culture, respiratory (NON-Expectorated)     Status: None   Collection Time: 12/27/14 11:08 AM  Result Value Ref Range Status   Specimen Description SPUTUM  Final   Special Requests Normal Reflexed from [id number]  Final   Gram Stain   Final    FEW WBC SEEN FEW GRAM POSITIVE COCCI IN CLUSTERS GOOD SPECIMEN - 80-90% WBCS    Culture MODERATE GROWTH STAPHYLOCOCCUS AUREUS  Final   Report Status 12/30/2014 FINAL  Final   Organism ID, Bacteria STAPHYLOCOCCUS AUREUS  Final      Susceptibility   Staphylococcus aureus - MIC*    CIPROFLOXACIN <=0.5 SENSITIVE Sensitive     ERYTHROMYCIN <=0.25 SENSITIVE Sensitive     GENTAMICIN <=0.5 SENSITIVE Sensitive     OXACILLIN 0.5 SENSITIVE Sensitive     TETRACYCLINE <=1 SENSITIVE Sensitive     TRIMETH/SULFA <=10 SENSITIVE Sensitive     CLINDAMYCIN <=0.25 SENSITIVE Sensitive     CEFOXITIN SCREEN NEGATIVE Sensitive     Inducible Clindamycin NEGATIVE Sensitive     * MODERATE GROWTH STAPHYLOCOCCUS AUREUS   Studies/Results: Dg Abd 1 View  12/29/2014   CLINICAL DATA:  Patient status post OG tube repositioning.  EXAM: ABDOMEN - 1 VIEW  COMPARISON:  12/29/2014  FINDINGS: Right femoral approach venous catheter is re- demonstrated. Monitoring leads overlie the patient. NG tube tip projects over the left upper quadrant, likely within the stomach. Lumbar spine degenerative changes. Nonobstructed bowel gas pattern.  IMPRESSION: NG tube tip projects over the left upper quadrant, likely within the stomach.   Electronically Signed   By: Annia Beltrew  Davis M.D.   On: 12/29/2014 17:11    Assessment/Plan: 69 y.o. male admitted 5/15 with CVA, followed by a seizure, requiring intubation. He has developed PNA with MSSA on culture, as well as  enterococcal bacteremia. He has a flexiseal in place and a central line. Remains intubated but not on any pressors. Has been febrile and with elevated WBC. Likely fevers due to the enterococcal bacteremia as recent abx had not covered this.  MSSA PNA - continue vanco (on for enterococcal bacteremia in setting of pcn allergy) and ancef as this will provide better MSSA coverage  Enterococcal bacteremia- has line in place (placed 5/18 per  report) - TTE neg but done 5./16 and bcx + from 5/20 Consider remove Central line   Decie Verne   12/31/2014, 4:09 PM

## 2015-01-01 DIAGNOSIS — G40909 Epilepsy, unspecified, not intractable, without status epilepticus: Secondary | ICD-10-CM

## 2015-01-01 LAB — GLUCOSE, CAPILLARY
GLUCOSE-CAPILLARY: 107 mg/dL — AB (ref 65–99)
Glucose-Capillary: 103 mg/dL — ABNORMAL HIGH (ref 65–99)
Glucose-Capillary: 113 mg/dL — ABNORMAL HIGH (ref 65–99)
Glucose-Capillary: 119 mg/dL — ABNORMAL HIGH (ref 65–99)
Glucose-Capillary: 120 mg/dL — ABNORMAL HIGH (ref 65–99)
Glucose-Capillary: 122 mg/dL — ABNORMAL HIGH (ref 65–99)

## 2015-01-01 LAB — CBC
HCT: 22.6 % — ABNORMAL LOW (ref 40.0–52.0)
Hemoglobin: 7.2 g/dL — ABNORMAL LOW (ref 13.0–18.0)
MCH: 25.4 pg — ABNORMAL LOW (ref 26.0–34.0)
MCHC: 31.6 g/dL — AB (ref 32.0–36.0)
MCV: 80.4 fL (ref 80.0–100.0)
Platelets: 249 10*3/uL (ref 150–440)
RBC: 2.81 MIL/uL — ABNORMAL LOW (ref 4.40–5.90)
RDW: 19 % — ABNORMAL HIGH (ref 11.5–14.5)
WBC: 14.3 10*3/uL — ABNORMAL HIGH (ref 3.8–10.6)

## 2015-01-01 LAB — CULTURE, BLOOD (ROUTINE X 2)
Culture: NO GROWTH
SPECIAL REQUESTS: NORMAL
SPECIAL REQUESTS: NORMAL

## 2015-01-01 LAB — BASIC METABOLIC PANEL
ANION GAP: 9 (ref 5–15)
BUN: 16 mg/dL (ref 6–20)
CALCIUM: 8.3 mg/dL — AB (ref 8.9–10.3)
CO2: 26 mmol/L (ref 22–32)
CREATININE: 1.84 mg/dL — AB (ref 0.61–1.24)
Chloride: 107 mmol/L (ref 101–111)
GFR calc Af Amer: 41 mL/min — ABNORMAL LOW (ref >60)
GFR, EST NON AFRICAN AMERICAN: 36 mL/min — AB (ref >60)
Glucose, Bld: 121 mg/dL — ABNORMAL HIGH (ref 65–99)
POTASSIUM: 3.5 mmol/L (ref 3.5–5.1)
SODIUM: 142 mmol/L (ref 135–145)

## 2015-01-01 MED ORDER — ADULT MULTIVITAMIN LIQUID CH
5.0000 mL | Freq: Every day | ORAL | Status: DC
  Administered 2015-01-01 – 2015-01-02 (×2): 5 mL via ORAL
  Filled 2015-01-01 (×3): qty 5

## 2015-01-01 MED ORDER — CEFAZOLIN SODIUM 1-5 GM-% IV SOLN
1.0000 g | Freq: Three times a day (TID) | INTRAVENOUS | Status: DC
  Administered 2015-01-01 – 2015-01-03 (×6): 1 g via INTRAVENOUS
  Filled 2015-01-01 (×10): qty 50

## 2015-01-01 MED ORDER — FOLIC ACID 1 MG PO TABS
1.0000 mg | ORAL_TABLET | Freq: Every day | ORAL | Status: DC
  Administered 2015-01-01 – 2015-01-02 (×2): 1 mg via ORAL
  Filled 2015-01-01 (×2): qty 1

## 2015-01-01 MED ORDER — FAMOTIDINE 20 MG PO TABS
20.0000 mg | ORAL_TABLET | Freq: Two times a day (BID) | ORAL | Status: DC
  Administered 2015-01-01 – 2015-01-02 (×3): 20 mg via ORAL
  Filled 2015-01-01 (×3): qty 1

## 2015-01-01 MED ORDER — VITAMIN B-1 100 MG PO TABS
100.0000 mg | ORAL_TABLET | Freq: Every day | ORAL | Status: DC
  Administered 2015-01-01 – 2015-01-02 (×2): 100 mg via ORAL
  Filled 2015-01-01 (×2): qty 1

## 2015-01-01 NOTE — Consult Note (Signed)
Palliative Medicine Inpatient Consult Follow Up Note   Name: Minard Millirons Date: 01/01/2015 MRN: [id number]  DOB: 1946-04-16  Referring Physician: Aldean Jewett, MD  Palliative Care consult requested for this 69 y.o. male for goals of medical therapy in patient with HTN, HLD, DM, ETOH abuse admitted with altered mental status now with VDRF & acute CVA  Pt is lying in bed in CCU, on vent, sedated. Family at bedside.    REVIEW OF SYSTEMS:  Patient is not able to provide ROS  CODE STATUS: DNR   PAST MEDICAL HISTORY: Past Medical History  Diagnosis Date  . Hypertension   . Hyperlipidemia   . Arthritis   . Gout     PAST SURGICAL HISTORY: History reviewed. No pertinent past surgical history.  Vital Signs: BP 142/81 mmHg  Pulse 60  Temp(Src) 97.9 F (36.6 C) (Oral)  Resp 20  Ht 5' 6"  (1.676 m)  Wt 125.1 kg (275 lb 12.7 oz)  BMI 44.54 kg/m2  SpO2 99% Filed Weights   12/30/14 0500 12/31/14 0527 01/01/15 0500  Weight: 129.9 kg (286 lb 6 oz) 122.8 kg (270 lb 11.6 oz) 125.1 kg (275 lb 12.7 oz)    Estimated body mass index is 44.54 kg/(m^2) as calculated from the following:   Height as of this encounter: 5' 6"  (1.676 m).   Weight as of this encounter: 125.1 kg (275 lb 12.7 oz).  PHYSICAL EXAM: General: Critically ill appearing HEENT: ETT/OGT in place Neck: Trachea midline  Cardiovascular: regular rate and rhythm Pulmonary/Chest: Poor air movemnt ant fields, no audible wheeze Abdominal: Soft, nontender, hypoactive bowel sounds GU: Foley present, clear yellow urine Extremities: + edema BLE's Neurological: moves R. side Skin: no rashes Psychiatric: sedated  LABS: CBC:    Component Value Date/Time   WBC 14.3* 01/01/2015 0345   HGB 7.2* 01/01/2015 0345   HCT 22.6* 01/01/2015 0345   PLT 249 01/01/2015 0345   MCV 80.4 01/01/2015 0345   NEUTROABS 23.5* 01/02/2015 1444   LYMPHSABS 1.9 01/06/2015 1444   MONOABS 2.2* 12/24/2014 1444   EOSABS 0.0 12/25/2014 1444   BASOSABS 0.0 12/21/2014 1444   Comprehensive Metabolic Panel:    Component Value Date/Time   NA 142 01/01/2015 0345   K 3.5 01/01/2015 0345   CL 107 01/01/2015 0345   CO2 26 01/01/2015 0345   BUN 16 01/01/2015 0345   CREATININE 1.84* 01/01/2015 0345   GLUCOSE 121* 01/01/2015 0345   CALCIUM 8.3* 01/01/2015 0345   AST 107* 12/23/2014 0408   ALT 29 12/23/2014 0408   ALKPHOS 67 12/23/2014 0408   BILITOT 1.4* 12/23/2014 0408   PROT 6.5 12/23/2014 0408   ALBUMIN 2.2* 12/29/2014 0515    IMPRESSION: Mr. Joiner is a 69 y.o. Man with HTN, HLD, DM, gout, OA and ongoing ETOH abuse. He was admitted 12/19/2014 with altered mental status and L facial droop.MRI shows R pontine stroke.Hospital course complicated by VDRF. Repeat head CT yesterday shows new R.MCA CVA. Sputum + for MRSA.  I met with pt's family including daughter and sister, Enid Derry, who is pt's designated Marine scientist. Updated them on results of head CT. Discussed options of extubate to comfort care vs continued aggressive therapy. Family ask for "a few more days" to see if pt improves. We will meet again Friday AM to readdress goals. Family in agreement with DNR status.   PLAN: 1. DNR 2. Family meeting Friday AM   More than 50% of the visit was spent in counseling/coordination of care: YES  Time  spent: 35 minutes

## 2015-01-01 NOTE — Consult Note (Signed)
CC: stroke MSSA PNA  HPI: Lucas Ortega is an 69 y.o. male male admitted 5/15 with R pontine CVA, followed by a seizure, requiring intubation. He has developed pna with MSSA on culture, as well as enterococcal bacteremia. S/p repeat CTH which shows subacute R parietal stroke.    Past Medical History  Diagnosis Date  . Hypertension   . Hyperlipidemia   . Arthritis   . Gout     History reviewed. No pertinent past surgical history.  Family History  Problem Relation Age of Onset  . Family history unknown: Yes    Social History:  reports that he has never smoked. He does not have any smokeless tobacco history on file. He reports that he drinks alcohol. He reports that he does not use illicit drugs.  Allergies  Allergen Reactions  . Penicillins Other (See Comments)    Unknown reaction    Medications: I have reviewed the patient's current medications.  ROS: Unable to obtain.   Physical Examination: Blood pressure 137/78, pulse 57, temperature 96.1 F (35.6 C), temperature source Axillary, resp. rate 17, height 5\' 6"  (1.676 m), weight 125.1 kg (275 lb 12.7 oz), SpO2 99 %.  Pt is sedated on propofol and fentanyl. Not following commands.  Minimal withdrawal painful stimuli bilaterally.  EOM appear to be intact No response to painful stimuli.    Laboratory Studies:   Basic Metabolic Panel:  Recent Labs Lab 12/27/14 1805 12/28/14 0203 12/28/14 0517 12/28/14 1039 12/29/14 0515 12/30/14 0458 12/31/14 0515 01/01/15 0345  NA 135 136 136 139 141 139 140 142  K 6.0* 5.9* 5.7* 5.1 4.5 3.8 3.3* 3.5  CL 105 106 106 106 107 105 106 107  CO2 24 24 24 26 27 27 27 26   GLUCOSE 104* 119* 125* 142* 124* 114* 123* 121*  BUN 17 18 18 17 17 16 17 16   CREATININE 2.12* 2.03* 2.07* 1.86* 2.04* 2.07* 1.99* 1.84*  CALCIUM 8.0* 8.0* 8.0* 8.3* 8.0* 7.9* 7.9* 8.3*  MG 2.2 2.1  --  2.1  --  2.0 2.0  --   PHOS 6.6* 6.2* 6.5* 6.1* 5.2* 4.9* 3.8  --     Liver Function Tests:  Recent  Labs Lab 12/27/14 1805 12/28/14 0203 12/28/14 0517 12/28/14 1039 12/29/14 0515  ALBUMIN 2.2* 2.2* 2.3* 2.3* 2.2*   No results for input(s): LIPASE, AMYLASE in the last 168 hours. No results for input(s): AMMONIA in the last 168 hours.  CBC:  Recent Labs Lab 12/28/14 0517 12/29/14 0515 12/30/14 0458 12/31/14 0515 01/01/15 0345  WBC 13.2* 16.1* 17.6* 17.4* 14.3*  HGB 8.8* 8.0* 7.3* 6.7* 7.2*  HCT 28.2* 25.5* 23.5* 20.9* 22.6*  MCV 82.8 82.1 82.4 80.6 80.4  PLT 174 159 189 211 249    Cardiac Enzymes: No results for input(s): CKTOTAL, CKMB, CKMBINDEX, TROPONINI in the last 168 hours.  BNP: Invalid input(s): POCBNP  CBG:  Recent Labs Lab 12/31/14 1208 12/31/14 1644 12/31/14 2126 01/01/15 0409 01/01/15 0729  GLUCAP 108* 94 102* 107* 119*    Microbiology: Results for orders placed or performed during the hospital encounter of 01/02/2015  Blood culture (routine x 2)     Status: None   Collection Time: 12/26/2014  2:44 PM  Result Value Ref Range Status   Specimen Description BLOOD  Final   Special Requests BLOOD  Final   Culture NO GROWTH 5 DAYS  Final   Report Status 12/27/2014 FINAL  Final  Blood culture (routine x 2)  Status: None   Collection Time: 01/13/2015  3:02 PM  Result Value Ref Range Status   Specimen Description BLOOD  Final   Special Requests BLOOD  Final   Culture NO GROWTH 5 DAYS  Final   Report Status 12/27/2014 FINAL  Final  Culture, blood (routine x 2)     Status: None   Collection Time: 12/27/14  8:58 AM  Result Value Ref Range Status   Specimen Description BLOOD  Final   Special Requests Normal  Final   Culture  Setup Time   Final    GRAM POSITIVE COCCI ANAEROBIC BOTTLE ONLY CRITICAL RESULT CALLED TO, READ BACK BY AND VERIFIED WITH: CALLED TO JULIE BRADDY AT 0250 ON 12/28/14/[facility] CONFIRMED BY PH    Culture ENTEROCOCCUS FAECALIS ANAEROBIC BOTTLE ONLY   Final   Report Status 01/01/2015 FINAL  Final   Organism ID, Bacteria ENTEROCOCCUS  FAECALIS  Final      Susceptibility   Enterococcus faecalis - MIC*    AMPICILLIN <=2 SENSITIVE Sensitive     LINEZOLID 2 SENSITIVE Sensitive     * ENTEROCOCCUS FAECALIS  Culture, blood (routine x 2)     Status: None (Preliminary result)   Collection Time: 12/27/14  9:00 AM  Result Value Ref Range Status   Specimen Description BLOOD  Final   Special Requests Normal  Final   Culture NO GROWTH 4 DAYS  Final   Report Status PENDING  Incomplete  Culture, expectorated sputum-assessment     Status: None (Preliminary result)   Collection Time: 12/27/14 11:08 AM  Result Value Ref Range Status   Specimen Description SPUTUM  Final   Special Requests Normal  Final   Sputum evaluation THIS SPECIMEN IS ACCEPTABLE FOR SPUTUM CULTURE  Final   Report Status PENDING  Incomplete  Culture, respiratory (NON-Expectorated)     Status: None   Collection Time: 12/27/14 11:08 AM  Result Value Ref Range Status   Specimen Description SPUTUM  Final   Special Requests Normal Reflexed from [id number]  Final   Gram Stain   Final    FEW WBC SEEN FEW GRAM POSITIVE COCCI IN CLUSTERS GOOD SPECIMEN - 80-90% WBCS    Culture MODERATE GROWTH STAPHYLOCOCCUS AUREUS  Final   Report Status 12/30/2014 FINAL  Final   Organism ID, Bacteria STAPHYLOCOCCUS AUREUS  Final      Susceptibility   Staphylococcus aureus - MIC*    CIPROFLOXACIN <=0.5 SENSITIVE Sensitive     ERYTHROMYCIN <=0.25 SENSITIVE Sensitive     GENTAMICIN <=0.5 SENSITIVE Sensitive     OXACILLIN 0.5 SENSITIVE Sensitive     TETRACYCLINE <=1 SENSITIVE Sensitive     TRIMETH/SULFA <=10 SENSITIVE Sensitive     CLINDAMYCIN <=0.25 SENSITIVE Sensitive     CEFOXITIN SCREEN NEGATIVE Sensitive     Inducible Clindamycin NEGATIVE Sensitive     * MODERATE GROWTH STAPHYLOCOCCUS AUREUS    Coagulation Studies: No results for input(s): LABPROT, INR in the last 72 hours.  Urinalysis: No results for input(s): COLORURINE, LABSPEC, PHURINE, GLUCOSEU, HGBUR, BILIRUBINUR,  KETONESUR, PROTEINUR, UROBILINOGEN, NITRITE, LEUKOCYTESUR in the last 168 hours.  Invalid input(s): APPERANCEUR  Lipid Panel:     Component Value Date/Time   CHOL 104 12/27/2014 1107   TRIG 107 12/28/2014 0517   HDL 30* 12/27/2014 1107   CHOLHDL 3.5 12/27/2014 1107   VLDL 21 12/27/2014 1107   LDLCALC 53 12/27/2014 1107    HgbA1C: No results found for: HGBA1C  Urine Drug Screen:  No results found for: LABOPIA, COCAINSCRNUR, LABBENZ,  AMPHETMU, THCU, LABBARB  Alcohol Level: No results for input(s): ETH in the last 168 hours.  Other results: EKG: normal EKG, normal sinus rhythm, unchanged from previous tracings.  Imaging: Ct Head Wo Contrast  12/31/2014   ADDENDUM REPORT: 12/31/2014 17:00  ADDENDUM: Evolution of the right side of brain stem infarct is also noted, image 11/series 2.   Electronically Signed   By: Signa Kell M.D.   On: 12/31/2014 17:00   12/31/2014   CLINICAL DATA:  Patient was admitted 9 days ago with stroke-like symptoms. Now unresponsive with left-sided facial droop.  EXAM: CT HEAD WITHOUT CONTRAST  TECHNIQUE: Contiguous axial images were obtained from the base of the skull through the vertex without intravenous contrast.  COMPARISON:  MRI from 12/23/2014  FINDINGS: There is a large area of low attenuation within the right parietal lobe and posterior frontal lobe compatible with subacute infarct. This is a new abnormality from the previous study, image 21/series 2. There is mild mass effect with right to left midline shaft measuring 3 mm, image 18 of series 2. Patchy low attenuation throughout the subcortical and periventricular white matter is noted compatible with chronic microvascular disease. There is prominence of the sulci and ventricles consistent with brain atrophy. No evidence for acute intracranial hemorrhage or mass. There is moderate mucosal thickening involving the paranasal sinuses. Fluid levels identified within the sphenoid sinus. The mastoid air cells are  clear. The calvarium appears intact.  IMPRESSION: 1. New, large area of low attenuation involving the posterior right frontal and anterior parietal lobe. This is compatible with a subacute right MCA distribution infarct. This is a new abnormality from brain MRI dated 12/23/2014. 2. No evidence for acute intracranial hemorrhage. 3. Pan sinus inflammation.  Electronically Signed: By: Signa Kell M.D. On: 12/31/2014 16:51     Assessment/Plan: 69 y.o. Male with R pontine stroke, respiratory failure, PNA with repeat CTH subacute R parietal stroke  - subacute stroke minimal midline shift <64mm don't think will swell anymore so would hold off mannitol and hypertonic saline.   - echo 5/16 with EF of 60-65, but if family wants to con't aggressive care strokes are in different vascular territories would consider TEE. Old stroke in posterior (basillar) and new subacute stroke in the anterior [facility] territory) appear to be embolic. ?sepsis, ? Endocorditis.  - Con't ASA and statin therapy.  - palliative care meeting on Friday.    Pauletta Browns    01/01/2015, 9:47 AM

## 2015-01-01 NOTE — Progress Notes (Signed)
[facility] - [place] at [facility]   PATIENT NAME: Lucas Ortega    MR#:  [id number]  DATE OF BIRTH:  08-08-1946  SUBJECTIVE:  Sedated on precedex and fentanyl   REVIEW OF SYSTEMS:  Unable to perform due to sedation  ROS DRUG ALLERGIES:   Allergies  Allergen Reactions  . Penicillins Other (See Comments)    Unknown reaction    VITALS:  Blood pressure 153/96, pulse 62, temperature 97.9 F (36.6 C), temperature source Axillary, resp. rate 35, height  (1.676 m), weight 125.1 kg (275 lb 12.7 oz), SpO2 94 %.  PHYSICAL EXAMINATION:   Physical Exam  Constitutional: He is well-developed, well-nourished, and in no distress. No distress.  HENT:  Head: Normocephalic and atraumatic.  Neck: Normal range of motion. Neck supple. No JVD present.  Cardiovascular: Normal rate, regular rhythm and normal heart sounds.   No murmur heard. Pulmonary/Chest: No respiratory distress. He has no wheezes.  ET tube in place, vent support.  Abdominal: Bowel sounds are normal. He exhibits no distension and no mass. There is no tenderness.  Neurological:  Sedated on vent support.  Skin: Skin is warm. No rash noted. He is not diaphoretic.    LABORATORY PANEL:   CBC  Recent Labs Lab 01/01/15 0345  WBC 14.3*  HGB 7.2*  HCT 22.6*  PLT 249   ------------------------------------------------------------------------------------------------------------------  Chemistries   Recent Labs Lab 12/31/14 0515 01/01/15 0345  NA 140 142  K 3.3* 3.5  CL 106 107  CO2 27 26  GLUCOSE 123* 121*  BUN 17 16  CREATININE 1.99* 1.84*  CALCIUM 7.9* 8.3*  MG 2.0  --    ------------------------------------------------------------------------------------------------------------------  Cardiac Enzymes No results for input(s): TROPONINI in the last 168  hours. ------------------------------------------------------------------------------------------------------------------  RADIOLOGY:  Dg Chest 2 View  01/05/2015   CLINICAL DATA:  Possible aspiration.  Abnormal lung sounds.  EXAM: CHEST  2 VIEW  COMPARISON:  None.  FINDINGS: The heart size and mediastinal contours are within normal limits. Both lungs are clear. No pneumothorax or pleural effusion is noted. The visualized skeletal structures are unremarkable.  IMPRESSION: No active cardiopulmonary disease.   Electronically Signed   By: Lucas Ortega, Ortega.   On: 01/01/2015 15:02   Ct Head (brain) Wo Contrast  12/25/2014   IMPRESSION: Minimal diffuse cortical atrophy. Mild chronic ischemic white matter disease. No acute intracranial abnormality seen. These results were called by telephone at the time of interpretation on 12/26/2014 at 2:42 pm to Lucas Ortega, who verbally acknowledged these results.   Electronically Signed   By: Lucas Ortega, Ortega.   On: 12/21/2014 14:44   Portable Chest 1 View  12/23/2014   IMPRESSION: Cardiomegaly  Calcified tortuous aorta  No obvious infiltrate, congestive heart failure or pneumothorax.  Evaluation limited by portable technique and patient's habitus.   Electronically Signed   By: Lucas Ortega Ortega.   On: 12/23/2014 07:31     ASSESSMENT AND PLAN:    This is 69 year old male with a history of hypertension and gout he was found by his family with left facial droop and left-sided weakness. He also has been drinking EtOH heavily. He had a seizure in the ER.  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  * Essential hypertension: Patient is on lisinopril and Norvasc at home,   Here on hydralazine when necessary. - Permissive hypertension in the setting of recent CVAs  * acute on CKD due to ATN baseline Cr is 1.2 - Cr improving slightly, good urine output - nephrology following  * anemia - in the setting of critical illness, renal failure - no bleeding noted - d/w Dr. Belia HemanKasa, no transfusion planned  Goals of care: Appreciate palliative care consultation. Patient was made a DO NOT RESUSCITATE this morning family meeting. Plan is for another family meeting on Friday 5/27.  CODE STATUS DO NOT RESUSCITATE  TOTAL TIME TAKING CARE OF THIS PATIENT: 30 min critical care.    Lucas Ortega, Lucas Ortega on 01/01/2015 at 3:52 PM  Between 7am to 6pm - Pager - [phone] After 6pm go to [email or web address] - password [email or web address] [facility]  [facility]  Office  [phone]  CC: Primary care physician; Lucas Ortega, Lucas MARIE, MD

## 2015-01-01 NOTE — Care Management (Signed)
Patient now  with  Additional subacute R parietal stroke per head CT 5/24.  Neurology is not recommending mannitol or hypertonic saline.  There will be a meeting with family on Friday to discuss whether to pursue peg and trach- LTAC or extubate - never to reintubate/ comfort care.

## 2015-01-01 NOTE — Progress Notes (Signed)
Date of Admission:  01/16/2015     ID: Lucas Ortega is a 69 y.o. male with  Recent CVA, sz, etoh abuse s/p intubation with enterococcal bacteremia and mssa pna  Active Problems:   CVA (cerebral infarction)   Seizure disorder   CVA (cerebral vascular accident)   Aspiration into airway    Subjective: Getting repeat CT for abnml gaze. Fevers seem to be decreasing  Medications:  . albuterol  2.5 mg Nebulization Q6H  . aspirin  81 mg Oral Daily  . atorvastatin  80 mg Oral q1800  .  ceFAZolin (ANCEF) IV  1 g Intravenous 3 times per day  . chlorhexidine  15 mL Mouth/Throat BID  . famotidine  20 mg Oral BID  . feeding supplement (PRO-STAT SUGAR FREE 64)  30 mL Oral TID  . folic acid  1 mg Oral Daily  . free water  25 mL Per Tube 6 times per day  . heparin  5,000 Units Subcutaneous 3 times per day  . metoprolol  5 mg Intravenous 4 times per day  . multivitamin  5 mL Oral Daily  . sodium chloride  3 mL Intravenous Q12H  . thiamine  100 mg Oral Daily  . vancomycin  1,250 mg Intravenous Q18H    Objective: Vital signs in last 24 hours: Temp:  [96.1 F (35.6 C)-98.2 F (36.8 C)] 97.9 F (36.6 C) (05/25 1200) Pulse Rate:  [57-80] 62 (05/25 1200) Resp:  [12-35] 35 (05/25 1200) BP: (128-159)/(67-96) 153/96 mmHg (05/25 1200) SpO2:  [92 %-100 %] 94 % (05/25 1200) FiO2 (%):  [24 %] 24 % (05/25 1404) Weight:  [125.1 kg (275 lb 12.7 oz)] 125.1 kg (275 lb 12.7 oz) (05/25 0500)  Constitutional: He is critically ill appearing HENT:  ETT in place Neck: Normal range of motion. Neck supple. No JVD present.  Cardiovascular: Normal rate, regular rhythm and normal heart sounds.  No murmur heard. Pulmonary/Chest: bil rhonchi ET tube in place, vent support.  Abdominal: Bowel sounds are normal. He exhibits no distension and no mass. There is no tenderness.  Neurological:  eye deviation to L, L eye is also down and out Sedated on vent support.  Foley, rectal tube in place Neck line on L-  no redness Skin: Skin is warm. No rash noted. He is not diaphoretic  Lab Results  Recent Labs  12/31/14 0515 01/01/15 0345  WBC 17.4* 14.3*  HGB 6.7* 7.2*  HCT 20.9* 22.6*  NA 140 142  K 3.3* 3.5  CL 106 107  CO2 27 26  BUN 17 16  CREATININE 1.99* 1.84*   Liver Panel No results for input(s): PROT, ALBUMIN, AST, ALT, ALKPHOS, BILITOT, BILIDIR, IBILI in the last 72 hours. Microbiology: Recent Results (from the past 240 hour(s))  Culture, blood (routine x 2)     Status: None   Collection Time: 12/27/14  8:58 AM  Result Value Ref Range Status   Specimen Description BLOOD  Final   Special Requests Normal  Final   Culture  Setup Time   Final    GRAM POSITIVE COCCI ANAEROBIC BOTTLE ONLY CRITICAL RESULT CALLED TO, READ BACK BY AND VERIFIED WITH: CALLED TO JULIE BRADDY AT 0250 ON 12/28/14/[facility] CONFIRMED BY PH    Culture ENTEROCOCCUS FAECALIS ANAEROBIC BOTTLE ONLY   Final   Report Status 01/01/2015 FINAL  Final   Organism ID, Bacteria ENTEROCOCCUS FAECALIS  Final      Susceptibility   Enterococcus faecalis - MIC*    AMPICILLIN <=2 SENSITIVE  Sensitive     LINEZOLID 2 SENSITIVE Sensitive     * ENTEROCOCCUS FAECALIS  Culture, blood (routine x 2)     Status: None   Collection Time: 12/27/14  9:00 AM  Result Value Ref Range Status   Specimen Description BLOOD  Final   Special Requests Normal  Final   Culture NO GROWTH 5 DAYS  Final   Report Status 01/01/2015 FINAL  Final  Culture, expectorated sputum-assessment     Status: None (Preliminary result)   Collection Time: 12/27/14 11:08 AM  Result Value Ref Range Status   Specimen Description SPUTUM  Final   Special Requests Normal  Final   Sputum evaluation THIS SPECIMEN IS ACCEPTABLE FOR SPUTUM CULTURE  Final   Report Status PENDING  Incomplete  Culture, respiratory (NON-Expectorated)     Status: None   Collection Time: 12/27/14 11:08 AM  Result Value Ref Range Status   Specimen Description SPUTUM  Final   Special Requests  Normal Reflexed from [id number]  Final   Gram Stain   Final    FEW WBC SEEN FEW GRAM POSITIVE COCCI IN CLUSTERS GOOD SPECIMEN - 80-90% WBCS    Culture MODERATE GROWTH STAPHYLOCOCCUS AUREUS  Final   Report Status 12/30/2014 FINAL  Final   Organism ID, Bacteria STAPHYLOCOCCUS AUREUS  Final      Susceptibility   Staphylococcus aureus - MIC*    CIPROFLOXACIN <=0.5 SENSITIVE Sensitive     ERYTHROMYCIN <=0.25 SENSITIVE Sensitive     GENTAMICIN <=0.5 SENSITIVE Sensitive     OXACILLIN 0.5 SENSITIVE Sensitive     TETRACYCLINE <=1 SENSITIVE Sensitive     TRIMETH/SULFA <=10 SENSITIVE Sensitive     CLINDAMYCIN <=0.25 SENSITIVE Sensitive     CEFOXITIN SCREEN NEGATIVE Sensitive     Inducible Clindamycin NEGATIVE Sensitive     * MODERATE GROWTH STAPHYLOCOCCUS AUREUS   Studies/Results: Ct Head Wo Contrast  12/31/2014   ADDENDUM REPORT: 12/31/2014 17:00  ADDENDUM: Evolution of the right side of brain stem infarct is also noted, image 11/series 2.   Electronically Signed   By: Signa Kellaylor  Stroud M.D.   On: 12/31/2014 17:00   12/31/2014   CLINICAL DATA:  Patient was admitted 9 days ago with stroke-like symptoms. Now unresponsive with left-sided facial droop.  EXAM: CT HEAD WITHOUT CONTRAST  TECHNIQUE: Contiguous axial images were obtained from the base of the skull through the vertex without intravenous contrast.  COMPARISON:  MRI from 12/23/2014  FINDINGS: There is a large area of low attenuation within the right parietal lobe and posterior frontal lobe compatible with subacute infarct. This is a new abnormality from the previous study, image 21/series 2. There is mild mass effect with right to left midline shaft measuring 3 mm, image 18 of series 2. Patchy low attenuation throughout the subcortical and periventricular white matter is noted compatible with chronic microvascular disease. There is prominence of the sulci and ventricles consistent with brain atrophy. No evidence for acute intracranial hemorrhage or  mass. There is moderate mucosal thickening involving the paranasal sinuses. Fluid levels identified within the sphenoid sinus. The mastoid air cells are clear. The calvarium appears intact.  IMPRESSION: 1. New, large area of low attenuation involving the posterior right frontal and anterior parietal lobe. This is compatible with a subacute right MCA distribution infarct. This is a new abnormality from brain MRI dated 12/23/2014. 2. No evidence for acute intracranial hemorrhage. 3. Pan sinus inflammation.  Electronically Signed: By: Signa Kellaylor  Stroud M.D. On: 12/31/2014 16:51    Assessment/Plan:  69 y.o. male admitted 5/15 with CVA, followed by a seizure, requiring intubation. He has developed PNA with MSSA on culture, as well as enterococcal bacteremia. He has a flexiseal in place and a central line. Remains intubated but not on any pressors. Has been febrile and with elevated WBC. Likely fevers due to the enterococcal bacteremia as recent abx had not covered this.  MSSA PNA - continue vanco (on for enterococcal bacteremia in setting of pcn allergy) and ancef as this will provide better MSSA coverage  Enterococcal bacteremia- has line in place (placed 5/18 per report) - TTE neg but done 5./16 and bcx + from 5/20 Consider remove Central line   Isreal Moline   01/01/2015, 4:17 PM

## 2015-01-01 NOTE — Progress Notes (Signed)
Nutrition Follow-up  DOCUMENTATION CODES:     INTERVENTION:   (EN: recommend to continue vital high protein at goal rate to meet nutritional needs)  NUTRITION DIAGNOSIS:  Inadequate oral intake related to inability to eat as evidenced by NPO status, ongoing but being addressed with tube feeding.    GOAL:   (Goal would be for pt to tolerate tube feeding at goal rate)    MONITOR:   (EN, Electrolyte and Renal Profile, Digestive system, Pulmonary Profile)  REASON FOR ASSESSMENT:   (follow-up) Enteral/tube feeding initiation and management  ASSESSMENT:  Pt remains on vent.  Electrolyte and Renal Profile:    Recent Labs Lab 12/28/14 1039 12/29/14 0515 12/30/14 0458 12/31/14 0515 01/01/15 0345  BUN 17 17 16 17 16   CREATININE 1.86* 2.04* 2.07* 1.99* 1.84*  NA 139 141 139 140 142  K 5.1 4.5 3.8 3.3* 3.5  MG 2.1  --  2.0 2.0  --   PHOS 6.1* 5.2* 4.9* 3.8  --    Glucose Profile:  Recent Labs  01/01/15 0409 01/01/15 0729 01/01/15 1143  GLUCAP 107* 119* 120*   Medications: precedex, fentanyl, no diprivan  Height:  Ht Readings from Last 1 Encounters:  06/17/15 5\' 6"  (1.676 m)    Weight:  Wt Readings from Last 1 Encounters:  01/01/15 275 lb 12.7 oz (125.1 kg)        Wt Readings from Last 10 Encounters:  01/01/15 275 lb 12.7 oz (125.1 kg)  12/23/14 270 lb (122.471 kg)    BMI:  Body mass index is 44.54 kg/(m^2).  Estimated Nutritional Needs:  Kcal:  1298-1652kcals, (11-14kcals/kg) using acutal body weight of 118kg  Protein:  129-161g protein (2.0-2.5g/kg) using IBW of 64.5kg  Fluid:  1613-1910235mL of fluid (25-4330mL/kg) using IBW of 64.5kg  Skin:  Reviewed, no issues  Diet Order:  Diet NPO time specified  EDUCATION NEEDS:  No education needs identified at this time   Intake/Output Summary (Last 24 hours) at 01/01/15 1331 Last data filed at 01/01/15 1200  Gross per 24 hour  Intake 2980.71 ml  Output   1425 ml  Net 1555.71 ml       HIGH Care Level  Kosei Rhodes B. Freida BusmanAllen, RD, LDN [phone] (pager)

## 2015-01-01 NOTE — Progress Notes (Signed)
Patient stable overnight. ON Vent sedated with a RASS of -4 per orders. Fent@200mcg  Precedex@0 . MVI@40 . Left IJ TLC Right Grion Vascath. F/c with adequate output. OG with TF Vital HP 45/ 25ml flush q4h. SR on monitor. See charting for assessments. VSS

## 2015-01-01 NOTE — Progress Notes (Signed)
Speech Therapy note: reviewed chart notes; MRI. Pt remains on full vent support. ST services will be available when appropriate. Please reconsult.

## 2015-01-02 ENCOUNTER — Inpatient Hospital Stay (HOSPITAL_COMMUNITY)
Admit: 2015-01-02 | Discharge: 2015-01-02 | Disposition: A | Discharge status: Home / Self Care | Payer: Medicare Other | Attending: Internal Medicine | Admitting: Internal Medicine

## 2015-01-02 DIAGNOSIS — I639 Cerebral infarction, unspecified: Secondary | ICD-10-CM

## 2015-01-02 DIAGNOSIS — I10 Essential (primary) hypertension: Secondary | ICD-10-CM

## 2015-01-02 DIAGNOSIS — J9600 Acute respiratory failure, unspecified whether with hypoxia or hypercapnia: Secondary | ICD-10-CM | POA: Diagnosis present

## 2015-01-02 LAB — BASIC METABOLIC PANEL
Anion gap: 8 (ref 5–15)
BUN: 23 mg/dL — ABNORMAL HIGH (ref 6–20)
CALCIUM: 8 mg/dL — AB (ref 8.9–10.3)
CO2: 27 mmol/L (ref 22–32)
CREATININE: 1.72 mg/dL — AB (ref 0.61–1.24)
Chloride: 107 mmol/L (ref 101–111)
GFR calc Af Amer: 45 mL/min — ABNORMAL LOW (ref >60)
GFR, EST NON AFRICAN AMERICAN: 39 mL/min — AB (ref >60)
GLUCOSE: 144 mg/dL — AB (ref 65–99)
Potassium: 3.1 mmol/L — ABNORMAL LOW (ref 3.5–5.1)
Sodium: 142 mmol/L (ref 135–145)

## 2015-01-02 LAB — CBC
HCT: 22.7 % — ABNORMAL LOW (ref 40.0–52.0)
Hemoglobin: 7.3 g/dL — ABNORMAL LOW (ref 13.0–18.0)
MCH: 26.1 pg (ref 26.0–34.0)
MCHC: 32.3 g/dL (ref 32.0–36.0)
MCV: 80.7 fL (ref 80.0–100.0)
PLATELETS: 302 10*3/uL (ref 150–440)
RBC: 2.81 MIL/uL — ABNORMAL LOW (ref 4.40–5.90)
RDW: 18.4 % — AB (ref 11.5–14.5)
WBC: 14.1 10*3/uL — ABNORMAL HIGH (ref 3.8–10.6)

## 2015-01-02 LAB — GLUCOSE, CAPILLARY
GLUCOSE-CAPILLARY: 124 mg/dL — AB (ref 65–99)
GLUCOSE-CAPILLARY: 139 mg/dL — AB (ref 65–99)
GLUCOSE-CAPILLARY: 127 mg/dL — AB (ref 65–99)
Glucose-Capillary: 134 mg/dL — ABNORMAL HIGH (ref 65–99)
Glucose-Capillary: 135 mg/dL — ABNORMAL HIGH (ref 65–99)

## 2015-01-02 LAB — PHOSPHORUS: Phosphorus: 4.6 mg/dL (ref 2.5–4.6)

## 2015-01-02 LAB — VANCOMYCIN, TROUGH: Vancomycin Tr: 19 ug/mL (ref 10–20)

## 2015-01-02 LAB — MAGNESIUM: MAGNESIUM: 1.7 mg/dL (ref 1.7–2.4)

## 2015-01-02 MED ORDER — SODIUM CHLORIDE 0.9 % IV SOLN
Freq: Once | INTRAVENOUS | Status: AC
  Administered 2015-01-02: 10:00:00 via INTRAVENOUS
  Filled 2015-01-02: qty 20

## 2015-01-02 MED ORDER — CLONAZEPAM 0.5 MG PO TABS
0.5000 mg | ORAL_TABLET | Freq: Two times a day (BID) | ORAL | Status: DC
  Administered 2015-01-02 (×2): 0.5 mg via ORAL
  Filled 2015-01-02 (×2): qty 1

## 2015-01-02 MED ORDER — POTASSIUM CHLORIDE 20 MEQ PO PACK
20.0000 meq | PACK | Freq: Two times a day (BID) | ORAL | Status: DC
  Administered 2015-01-02 (×2): 20 meq via ORAL
  Filled 2015-01-02 (×2): qty 1

## 2015-01-02 MED ORDER — ALBUTEROL SULFATE (2.5 MG/3ML) 0.083% IN NEBU: 2.5000 mg | INHALATION_SOLUTION | RESPIRATORY_TRACT | Status: DC | PRN

## 2015-01-02 MED ORDER — MAGNESIUM SULFATE 2 GM/50ML IV SOLN
2.0000 g | Freq: Once | INTRAVENOUS | Status: AC
  Administered 2015-01-02: 2 g via INTRAVENOUS
  Filled 2015-01-02: qty 50

## 2015-01-02 MED ORDER — VANCOMYCIN HCL 10 G IV SOLR
1250.0000 mg | INTRAVENOUS | Status: DC
  Filled 2015-01-02: qty 1250

## 2015-01-02 NOTE — Progress Notes (Signed)
[facility] - [place] at [facility]   PATIENT NAME: Lucas Ortega    MR#:  [id number]  DATE OF BIRTH:  15-Feb-1946  SUBJECTIVE:   Remains sedated and intubated   REVIEW OF SYSTEMS:  Unable to perform due to sedation  Review of Systems  Unable to perform ROS  DRUG ALLERGIES:   Allergies  Allergen Reactions  . Penicillins Other (See Comments)    Unknown reaction    VITALS:  Blood pressure 118/61, pulse 71, temperature 99.5 F (37.5 C), temperature source Axillary, resp. rate 22, height 5\' 6"  (1.676 m), weight 131.7 kg (290 lb 5.5 oz), SpO2 98 %.  PHYSICAL EXAMINATION:   Physical Exam  Constitutional: He is well-developed, well-nourished, and in no distress. No distress.  Critically ill w/ ET tube in place.   HENT:  Head: Normocephalic and atraumatic.  Neck: Normal range of motion. Neck supple. No JVD present.  Cardiovascular: Normal rate, regular rhythm and normal heart sounds.   No murmur heard. Pulmonary/Chest: No respiratory distress. He has no wheezes.  ET tube in place, vent support.  Abdominal: Bowel sounds are normal. He exhibits no distension and no mass. There is no tenderness.  Musculoskeletal: He exhibits no edema.  Neurological:  Sedated on vent support.  Skin: Skin is warm. No rash noted. He is not diaphoretic.  No peripheral edema b/l.     LABORATORY PANEL:   CBC  Recent Labs Lab 01/02/15 0428  WBC 14.1*  HGB 7.3*  HCT 22.7*  PLT 302   ------------------------------------------------------------------------------------------------------------------  Chemistries   Recent Labs Lab 01/02/15 0428  NA 142  K 3.1*  CL 107  CO2 27  GLUCOSE 144*  BUN 23*  CREATININE 1.72*  CALCIUM 8.0*  MG 1.7   ------------------------------------------------------------------------------------------------------------------  Cardiac Enzymes No results for input(s): TROPONINI in the last 168  hours. ------------------------------------------------------------------------------------------------------------------  RADIOLOGY:  Dg Chest 2 View  12/18/2014   CLINICAL DATA:  Possible aspiration.  Abnormal lung sounds.  EXAM: CHEST  2 VIEW  COMPARISON:  None.  FINDINGS: The heart size and mediastinal contours are within normal limits. Both lungs are clear. No pneumothorax or pleural effusion is noted. The visualized skeletal structures are unremarkable.  IMPRESSION: No active cardiopulmonary disease.   Electronically Signed   By: Lupita RaiderJames  Green Jr, M.D.   On: 12/09/2014 15:02   Ct Head (brain) Wo Contrast  01/06/2015   IMPRESSION: Minimal diffuse cortical atrophy. Mild chronic ischemic white matter disease. No acute intracranial abnormality seen. These results were called by telephone at the time of interpretation on 12/15/2014 at 2:42 pm to Dr. Lenard LancePaduchowski, who verbally acknowledged these results.   Electronically Signed   By: Lupita RaiderJames  Green Jr, M.D.   On: 12/23/2014 14:44   Portable Chest 1 View  12/23/2014   IMPRESSION: Cardiomegaly  Calcified tortuous aorta  No obvious infiltrate, congestive heart failure or pneumothorax.  Evaluation limited by portable technique and patient's habitus.   Electronically Signed   By: Lacy DuverneySteven  Olson M.D.   On: 12/23/2014 07:31     ASSESSMENT AND PLAN:    This is 69 year old male with a history of hypertension and gout he was found by his family with left facial droop and left-sided weakness. He also has been drinking EtOH heavily. He had a seizure in the ER.  * sepsis - due to enterococcal bacteremia, MSSA pneumonia - appreciate ID consultation, cont. Vanc, Ancef for now.  - replace central line when possible.  REpeat BC (-) so far.  -  had repeat Echo today to r/o Endocarditis and will follow up.  - off vasopressors as of today  * acute respiratory failure due to Aspiration pneumonia - Pulm. Following and no plans of weaning at this time.  - cont. Vent.  Support.   * Stroke:   - right pontine infarct on admission and with new subacute right parietal infarct - appreciate neurology consultation - continue ASA statin - repeat Echo done today and pending.   * Seizure: patient had a witnessed seizure in the emergency department - no further seizures.  Was on Keppra and now weaned off it.   * hypokalemia- CCU replacement protocol - repeat in a.m  *  EtOH dependence: sedation now.  * Essential hypertension:  Cont. PRN hydralazine.  - stable.   * acute on CKD due to ATN baseline Cr is 1.2 - Cr. Today is 1.7.  good urine output - nephrology following   * anemia - in the setting of critical illness, renal failure - transfused 1 unit on 5/23 and Hg. Improved and stable.   * Nutrition - tolerating tube feeding.   Goals of care: Appreciate palliative care consultation. Pt. Is DNR.  Plan for family meeting tomorrow.  Family leaning towards withdrawing care.   CODE STATUS DO NOT RESUSCITATE  TOTAL Critical care TIME TAKING CARE OF THIS PATIENT: 30 min    Lorinda Copland J M.D on 01/02/2015 at 1:28 PM  Between 7am to 6pm - Pager - [phone] After 6pm go to [email or web address] - password [email or web address] [facility]  [facility]  Office  [phone]  CC: Primary care physician; Ashley Mariner, MD

## 2015-01-02 NOTE — Progress Notes (Signed)
Nutrition Follow-up  DOCUMENTATION CODES:     INTERVENTION:   (EN: recommend continuing vital high protein at goal rate of 6245ml/hr (meeting 100% kcal needs and protein needs), providing 1535mg  K and 864 mg phosphorus.)  NUTRITION DIAGNOSIS:  Inadequate oral intake related to inability to eat as evidenced by NPO status.    GOAL:   (Goal would be for pt to tolerate tube feeding at goal rate)    MONITOR:   (EN, Electrolyte and Renal Profile, Digestive system, Pulmonary Profile)  REASON FOR ASSESSMENT:   (follow-up) Enteral/tube feeding initiation and management  ASSESSMENT:  Pt remains on vent. Pallative  Care following, planning to meet with family on Friday.   Electrolyte and Renal Profile:    Recent Labs Lab 12/30/14 0458 12/31/14 0515 01/01/15 0345 01/02/15 0428  BUN 16 17 16  23*  CREATININE 2.07* 1.99* 1.84* 1.72*  NA 139 140 142 142  K 3.8 3.3* 3.5 3.1*  MG 2.0 2.0  --  1.7  PHOS 4.9* 3.8  --  4.6   Medications: fentanyl, precedex, no diprivan  Height:  Ht Readings from Last 1 Encounters:  2014/10/28 5\' 6"  (1.676 m)    Weight:  Wt Readings from Last 1 Encounters:  01/02/15 290 lb 5.5 oz (131.7 kg)      Wt Readings from Last 10 Encounters:  01/02/15 290 lb 5.5 oz (131.7 kg)  12/23/14 270 lb (122.471 kg)    BMI:  Body mass index is 46.89 kg/(m^2).  Estimated Nutritional Needs:  Kcal:  1298-1652kcals, (11-14kcals/kg) using acutal body weight of 118kg  Protein:  129-161g protein (2.0-2.5g/kg) using IBW of 64.5kg  Fluid:  1613-197035mL of fluid (25-7130mL/kg) using IBW of 64.5kg  Skin:  Reviewed, no issues  Diet Order:  Diet NPO time specified  EDUCATION NEEDS:  No education needs identified at this time   Intake/Output Summary (Last 24 hours) at 01/02/15 1056 Last data filed at 01/02/15 0646  Gross per 24 hour  Intake    175 ml  Output   2101 ml  Net  -1926 ml    Last BM:  Rectal tube in place 300ml output noted this  am.  HIGH Care Level  Mahsa Hanser B. Freida BusmanAllen, RD, LDN [phone] (pager)

## 2015-01-02 NOTE — Consult Note (Signed)
Palliative Medicine Inpatient Consult Follow Up Note   Name: Lucas Ortega Date: 01/02/2015 MRN: [id number]  DOB: [birth year]  Referring Physician: Houston SirenVivek J Sainani, MD  Palliative Care consult requested for this 69 y.o. male for goals of medical therapy in patient with HTN, HLD, DM, ETOH abuse admitted with altered mental status now with VDRF & acute CVA  Pt is lying in bed in CCU, on vent, sedated. Family not present.  REVIEW OF SYSTEMS:  Patient is not able to provide ROS  CODE STATUS: DNR   PAST MEDICAL HISTORY: Past Medical History  Diagnosis Date  . Hypertension   . Hyperlipidemia   . Arthritis   . Gout     PAST SURGICAL HISTORY: History reviewed. No pertinent past surgical history.  Vital Signs: BP 118/61 mmHg  Pulse 71  Temp(Src) 99.5 F (37.5 C) (Axillary)  Resp 22  Ht 5\' 6"  (1.676 m)  Wt 131.7 kg (290 lb 5.5 oz)  BMI 46.89 kg/m2  SpO2 98% Filed Weights   12/31/14 0527 01/01/15 0500 01/02/15 0645  Weight: 122.8 kg (270 lb 11.6 oz) 125.1 kg (275 lb 12.7 oz) 131.7 kg (290 lb 5.5 oz)    Estimated body mass index is 46.89 kg/(m^2) as calculated from the following:   Height as of this encounter: 5\' 6"  (1.676 m).   Weight as of this encounter: 131.7 kg (290 lb 5.5 oz).  PHYSICAL EXAM: General: Critically ill appearing HEENT: ETT/OGT in place Neck: Trachea midline  Cardiovascular: regular rate and rhythm Pulmonary/Chest: Poor air movemnt ant fields, no audible wheeze Abdominal: Soft, nontender, hypoactive bowel sounds GU: Foley present, clear yellow urine Extremities: + edema BLE's Neurological: L.eye deviation to L, moves RUE > RLE, flaccid L.UE Skin: no rashes Psychiatric: unable to assess, sedated  LABS: CBC:    Component Value Date/Time   WBC 14.1* 01/02/2015 0428   HGB 7.3* 01/02/2015 0428   HCT 22.7* 01/02/2015 0428   PLT 302 01/02/2015 0428   MCV 80.7 01/02/2015 0428   NEUTROABS 23.5* 02-02-15 1444   LYMPHSABS 1.9 02-02-15 1444   MONOABS  2.2* 02-02-15 1444   EOSABS 0.0 02-02-15 1444   BASOSABS 0.0 02-02-15 1444   Comprehensive Metabolic Panel:    Component Value Date/Time   NA 142 01/02/2015 0428   K 3.1* 01/02/2015 0428   CL 107 01/02/2015 0428   CO2 27 01/02/2015 0428   BUN 23* 01/02/2015 0428   CREATININE 1.72* 01/02/2015 0428   GLUCOSE 144* 01/02/2015 0428   CALCIUM 8.0* 01/02/2015 0428   AST 107* 12/23/2014 0408   ALT 29 12/23/2014 0408   ALKPHOS 67 12/23/2014 0408   BILITOT 1.4* 12/23/2014 0408   PROT 6.5 12/23/2014 0408   ALBUMIN 2.2* 12/29/2014 0515    IMPRESSION: Lucas Ortega is a 69 y.o. Man with HTN, HLD, DM, gout, OA and ongoing ETOH abuse. He was admitted 10-Dec-2014 with altered mental status and L facial droop.MRI shows R pontine stroke.Hospital course complicated by VDRF. Sputum + for MSSA. 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   Pt requiring sedation for agitation. No purposeful movement. Prognosis poor. Family meeting tomorrow AM to further discuss goals of therapy.   PLAN: Family meeting 5/27   More than 50% of the visit was spent in counseling/coordination of care: YES  Time spent: 25 minutes

## 2015-01-02 NOTE — Consult Note (Signed)
PULMONARY / CRITICAL CARE MEDICINE   Name: Lucas Ortega MRN: [id number] DOB: 05-06-1946    ADMISSION DATE:  2015/04/16   CHIEF COMPLAINT:   Acute CVA with inability to clear secretions   SUBJECTIVE   Patient remains intubated,sedated.  Blood cultures + E. Faecalis Sputum cultures + Satph aureaus  Patient with acute CVA RT, new since admission -patient requires intubation on Vent for air instability Prognosis is poor       PAST MEDICAL HISTORY    :  Past Medical History  Diagnosis Date  . Hypertension   . Hyperlipidemia   . Arthritis   . Gout    History reviewed. No pertinent past surgical history. Prior to Admission medications   Medication Sig Start Date End Date Taking? Authorizing Provider  amLODipine (NORVASC) 5 MG tablet Take 5 mg by mouth daily.   Yes Historical Provider, MD  colchicine 0.6 MG tablet Take 0.6 mg by mouth daily.   Yes Historical Provider, MD  lisinopril (PRINIVIL,ZESTRIL) 40 MG tablet Take 40 mg by mouth daily.   Yes Historical Provider, MD  Multiple Vitamins-Minerals (CENTRUM SILVER ADULT 50+) TABS Take 1 tablet by mouth daily.   Yes Historical Provider, MD  vitamin B-12 (CYANOCOBALAMIN) 1000 MCG tablet Take 1,000 mcg by mouth daily.   Yes Historical Provider, MD   Allergies  Allergen Reactions  . Penicillins Other (See Comments)    Unknown reaction     FAMILY HISTORY   Family History  Problem Relation Age of Onset  . Family history unknown: Yes      SOCIAL HISTORY    reports that he has never smoked. He does not have any smokeless tobacco history on file. He reports that he drinks alcohol. He reports that he does not use illicit drugs.  Review of Systems  Unable to perform ROS: critical illness      VITAL SIGNS    Temp:  [97.9 F (36.6 C)-100.3 F (37.9 C)] 99.1 F (37.3 C) (05/26 0801) Pulse Rate:  [62-104] 69 (05/26 0900) Resp:  [17-35] 21 (05/26 0900) BP: (124-178)/(65-96) 124/66 mmHg (05/26 0900) SpO2:  [92  %-100 %] 98 % (05/26 0900) FiO2 (%):  [24 %] 24 % (05/26 0801) Weight:  [290 lb 5.5 oz (131.7 kg)] 290 lb 5.5 oz (131.7 kg) (05/26 0645) HEMODYNAMICS:   VENTILATOR SETTINGS: Vent Mode:  [-] PRVC FiO2 (%):  [24 %] 24 % Set Rate:  [16 bmp] 16 bmp Vt Set:  [500 mL] 500 mL PEEP:  [5 cmH20] 5 cmH20 Plateau Pressure:  [17 cmH20] 17 cmH20 INTAKE / OUTPUT:  Intake/Output Summary (Last 24 hours) at 01/02/15 0938 Last data filed at 01/02/15 0646  Gross per 24 hour  Intake    175 ml  Output   2101 ml  Net  -1926 ml       PHYSICAL EXAM   Physical Exam  Constitutional: No distress.  Increased oral secretions  HENT:  Head: Normocephalic and atraumatic.  Eyes: Pupils are equal, round, and reactive to light. No scleral icterus.  Neck: Normal range of motion. Neck supple.  Cardiovascular: Normal rate and regular rhythm.   No murmur heard. Pulmonary/Chest: No respiratory distress. He has no wheezes. He has rales.  resp distress  Abdominal: Soft. He exhibits no distension. There is no tenderness.  Musculoskeletal: He exhibits no edema.  Neurological: He displays normal reflexes. Coordination normal.  Gcs<8T   Skin: Skin is warm. No rash noted. He is not diaphoretic.  LABS   LABS:  CBC  Recent Labs Lab 12/31/14 0515 01/01/15 0345 01/02/15 0428  WBC 17.4* 14.3* 14.1*  HGB 6.7* 7.2* 7.3*  HCT 20.9* 22.6* 22.7*  PLT 211 249 302   Coag's  Recent Labs Lab 12/28/14 0517  APTT 36   BMET  Recent Labs Lab 12/31/14 0515 01/01/15 0345 01/02/15 0428  NA 140 142 142  K 3.3* 3.5 3.1*  CL 106 107 107  CO2 BUN 17 16 23*  CREATININE 1.99* 1.84* 1.72*  GLUCOSE 123* 121* 144*   Electrolytes  Recent Labs Lab 12/30/14 0458 12/31/14 0515 01/01/15 0345 01/02/15 0428  CALCIUM 7.9* 7.9* 8.3* 8.0*  MG 2.0 2.0  --  1.7  PHOS 4.9* 3.8  --  4.6   Sepsis Markers No results for input(s): LATICACIDVEN, PROCALCITON, O2SATVEN in the last 168  hours. ABG No results for input(s): PHART, PCO2ART, PO2ART in the last 168 hours. Liver Enzymes  Recent Labs Lab 12/28/14 0517 12/28/14 1039 12/29/14 0515  ALBUMIN 2.3* 2.3* 2.2*   Cardiac Enzymes No results for input(s): TROPONINI, PROBNP in the last 168 hours. Glucose  Recent Labs Lab 01/01/15 0729 01/01/15 1143 01/01/15 1709 01/01/15 1956 01/01/15 2350 01/02/15 0355  GLUCAP 119* 120* 103* 113* 122* 124*     Recent Results (from the past 240 hour(s))  Culture, blood (routine x 2)     Status: None   Collection Time: 12/27/14  8:58 AM  Result Value Ref Range Status   Specimen Description BLOOD  Final   Special Requests Normal  Final   Culture  Setup Time   Final    GRAM POSITIVE COCCI ANAEROBIC BOTTLE ONLY CRITICAL RESULT CALLED TO, READ BACK BY AND VERIFIED WITH: CALLED TO JULIE BRADDY AT 0250 ON 12/28/14/[facility]WW CONFIRMED BY PH    Culture ENTEROCOCCUS FAECALIS ANAEROBIC BOTTLE ONLY   Final   Report Status 01/01/2015 FINAL  Final   Organism ID, Bacteria ENTEROCOCCUS FAECALIS  Final      Susceptibility   Enterococcus faecalis - MIC*    AMPICILLIN <=2 SENSITIVE Sensitive     LINEZOLID 2 SENSITIVE Sensitive     * ENTEROCOCCUS FAECALIS  Culture, blood (routine x 2)     Status: None   Collection Time: 12/27/14  9:00 AM  Result Value Ref Range Status   Specimen Description BLOOD  Final   Special Requests Normal  Final   Culture NO GROWTH 5 DAYS  Final   Report Status 01/01/2015 FINAL  Final  Culture, expectorated sputum-assessment     Status: None (Preliminary result)   Collection Time: 12/27/14 11:08 AM  Result Value Ref Range Status   Specimen Description SPUTUM  Final   Special Requests Normal  Final   Sputum evaluation THIS SPECIMEN IS ACCEPTABLE FOR SPUTUM CULTURE  Final   Report Status PENDING  Incomplete  Culture, respiratory (NON-Expectorated)     Status: None   Collection Time: 12/27/14 11:08 AM  Result Value Ref Range Status   Specimen Description  SPUTUM  Final   Special Requests Normal Reflexed from [id number]  Final   Gram Stain   Final    FEW WBC SEEN FEW GRAM POSITIVE COCCI IN CLUSTERS GOOD SPECIMEN - 80-90% WBCS    Culture MODERATE GROWTH STAPHYLOCOCCUS AUREUS  Final   Report Status 12/30/2014 FINAL  Final   Organism ID, Bacteria STAPHYLOCOCCUS AUREUS  Final      Susceptibility   Staphylococcus aureus - MIC*    CIPROFLOXACIN <=0.5 SENSITIVE Sensitive  ERYTHROMYCIN <=0.25 SENSITIVE Sensitive     GENTAMICIN <=0.5 SENSITIVE Sensitive     OXACILLIN 0.5 SENSITIVE Sensitive     TETRACYCLINE <=1 SENSITIVE Sensitive     TRIMETH/SULFA <=10 SENSITIVE Sensitive     CLINDAMYCIN <=0.25 SENSITIVE Sensitive     CEFOXITIN SCREEN NEGATIVE Sensitive     Inducible Clindamycin NEGATIVE Sensitive     * MODERATE GROWTH STAPHYLOCOCCUS AUREUS      IMAGING    No results found.      ASSESSMENT/PLAN   69 yo AAm admitted to ICU for acute CVA with inability to clear secretions intubated for resp failure. Acute DT's. With severe sepsis from E Faecalis bacteremia and Staph aureus pneumonia  PULMONARY-unable to protect airway due to RT CVA  -Respiratory Failure -continue Full MV support -continue Bronchodilator Therapy  CARDIOVASCULAR -needs ICU monitoring  RENAL -watch UO  GASTROINTESTINAL -Needs PEG tube for survival-will re-consult GI if needed -on  OG tube feeds  HEMATOLOGIC -follow h/h  INFECTIOUS-Staph aureus/ASPIRATION PNEUMONIA E faecalis bacteremia -follow up ID consult  ENDOCRINE Follow FSBS  NEUROLOGIC encephalopathy from Dt's and acute CVA -assess underlying neuro status   Palliative care team consulted, recommend DNR status and wean to extubate as some point Will need to discuss with family regarding PEG tube placement.      I have personally obtained a history, examined the patient, evaluated laboratory and imaging results, formulated the assessment and plan and placed orders.  The Patient requires  high complexity decision making for assessment and support, frequent evaluation and titration of therapies, application of advanced monitoring technologies and extensive interpretation of multiple databases. Critical Care Time devoted to patient care services described in this note is 40 minutes.   Overall, patient is critically ill, prognosis is guarded. Patient at high risk for cardiac arrest and death.   Lucie Leather, M.D. Pulmonary & Critical Care Medicine [facility] Medical Director Intensive Care Unit   01/02/2015, 9:38 AM

## 2015-01-02 NOTE — Progress Notes (Signed)
Date of Admission:  01/13/15     ID: Lucas Ortega is a 69 y.o. male with  Recent CVA, sz, etoh abuse s/p intubation with enterococcal bacteremia and mssa pna  Active Problems:   CVA (cerebral infarction)   Seizure disorder   CVA (cerebral vascular accident)   Aspiration into airway   Acute respiratory failure    Subjective: CT shows new stroke - MCA Fevers down  Medications:  . albuterol  2.5 mg Nebulization Q6H  . aspirin  81 mg Oral Daily  . atorvastatin  80 mg Oral q1800  .  ceFAZolin (ANCEF) IV  1 g Intravenous 3 times per day  . chlorhexidine  15 mL Mouth/Throat BID  . clonazePAM  0.5 mg Oral BID  . famotidine  20 mg Oral BID  . feeding supplement (PRO-STAT SUGAR FREE 64)  30 mL Oral TID  . folic acid  1 mg Oral Daily  . free water  25 mL Per Tube 6 times per day  . heparin  5,000 Units Subcutaneous 3 times per day  . metoprolol  5 mg Intravenous 4 times per day  . multivitamin  5 mL Oral Daily  . potassium chloride  20 mEq Oral BID  . sodium chloride  3 mL Intravenous Q12H  . thiamine  100 mg Oral Daily  . vancomycin  1,250 mg Intravenous Q18H    Objective: Vital signs in last 24 hours: Temp:  [98.4 F (36.9 C)-100.3 F (37.9 C)] 99.5 F (37.5 C) (05/26 1200) Pulse Rate:  [62-104] 71 (05/26 1200) Resp:  [17-33] 22 (05/26 1200) BP: (118-178)/(61-93) 118/61 mmHg (05/26 1200) SpO2:  [92 %-100 %] 98 % (05/26 1200) FiO2 (%):  [24 %] 24 % (05/26 1200) Weight:  [131.7 kg (290 lb 5.5 oz)] 131.7 kg (290 lb 5.5 oz) (05/26 0645)  Constitutional: He is critically ill appearing HENT:  ETT in place Neck: Normal range of motion. Neck supple. No JVD present.  Cardiovascular: Normal rate, regular rhythm and normal heart sounds.  No murmur heard. Pulmonary/Chest: bil rhonchi ET tube in place, vent support.  Abdominal: Bowel sounds are normal. He exhibits no distension and no mass. There is no tenderness.  Neurological:  eye deviation to L, L eye is also down and  out Sedated on vent support.  Foley, rectal tube in place Neck line on L- no redness Skin: Skin is warm. No rash noted. He is not diaphoretic  Lab Results  Recent Labs  01/01/15 0345 01/02/15 0428  WBC 14.3* 14.1*  HGB 7.2* 7.3*  HCT 22.6* 22.7*  NA 142 142  K 3.5 3.1*  CL 107 107  CO2 26 27  BUN 16 23*  CREATININE 1.84* 1.72*   Liver Panel No results for input(s): PROT, ALBUMIN, AST, ALT, ALKPHOS, BILITOT, BILIDIR, IBILI in the last 72 hours. Microbiology: Recent Results (from the past 240 hour(s))  Culture, blood (routine x 2)     Status: None   Collection Time: 12/27/14  8:58 AM  Result Value Ref Range Status   Specimen Description BLOOD  Final   Special Requests Normal  Final   Culture  Setup Time   Final    GRAM POSITIVE COCCI ANAEROBIC BOTTLE ONLY CRITICAL RESULT CALLED TO, READ BACK BY AND VERIFIED WITH: CALLED TO JULIE BRADDY AT 0250 ON 12/28/14/RWW CONFIRMED BY PH    Culture ENTEROCOCCUS FAECALIS ANAEROBIC BOTTLE ONLY   Final   Report Status 01/01/2015 FINAL  Final   Organism ID, Bacteria ENTEROCOCCUS FAECALIS  Final      Susceptibility   Enterococcus faecalis - MIC*    AMPICILLIN <=2 SENSITIVE Sensitive     LINEZOLID 2 SENSITIVE Sensitive     * ENTEROCOCCUS FAECALIS  Culture, blood (routine x 2)     Status: None   Collection Time: 12/27/14  9:00 AM  Result Value Ref Range Status   Specimen Description BLOOD  Final   Special Requests Normal  Final   Culture NO GROWTH 5 DAYS  Final   Report Status 01/01/2015 FINAL  Final  Culture, expectorated sputum-assessment     Status: None (Preliminary result)   Collection Time: 12/27/14 11:08 AM  Result Value Ref Range Status   Specimen Description SPUTUM  Final   Special Requests Normal  Final   Sputum evaluation THIS SPECIMEN IS ACCEPTABLE FOR SPUTUM CULTURE  Final   Report Status PENDING  Incomplete  Culture, respiratory (NON-Expectorated)     Status: None   Collection Time: 12/27/14 11:08 AM  Result  Value Ref Range Status   Specimen Description SPUTUM  Final   Special Requests Normal Reflexed from [id number]  Final   Gram Stain   Final    FEW WBC SEEN FEW GRAM POSITIVE COCCI IN CLUSTERS GOOD SPECIMEN - 80-90% WBCS    Culture MODERATE GROWTH STAPHYLOCOCCUS AUREUS  Final   Report Status 12/30/2014 FINAL  Final   Organism ID, Bacteria STAPHYLOCOCCUS AUREUS  Final      Susceptibility   Staphylococcus aureus - MIC*    CIPROFLOXACIN <=0.5 SENSITIVE Sensitive     ERYTHROMYCIN <=0.25 SENSITIVE Sensitive     GENTAMICIN <=0.5 SENSITIVE Sensitive     OXACILLIN 0.5 SENSITIVE Sensitive     TETRACYCLINE <=1 SENSITIVE Sensitive     TRIMETH/SULFA <=10 SENSITIVE Sensitive     CLINDAMYCIN <=0.25 SENSITIVE Sensitive     CEFOXITIN SCREEN NEGATIVE Sensitive     Inducible Clindamycin NEGATIVE Sensitive     * MODERATE GROWTH STAPHYLOCOCCUS AUREUS  Culture, blood (routine x 2)     Status: None (Preliminary result)   Collection Time: 01/01/15 11:54 PM  Result Value Ref Range Status   Specimen Description BLOOD  Final   Special Requests NONE  Final   Culture NO GROWTH < 12 HOURS  Final   Report Status PENDING  Incomplete  Culture, blood (routine x 2)     Status: None (Preliminary result)   Collection Time: 01/01/15 11:54 PM  Result Value Ref Range Status   Specimen Description BLOOD  Final   Special Requests NONE  Final   Culture NO GROWTH < 12 HOURS  Final   Report Status PENDING  Incomplete   Studies/Results: Ct Head Wo Contrast  12/31/2014   ADDENDUM REPORT: 12/31/2014 17:00  ADDENDUM: Evolution of the right side of brain stem infarct is also noted, image 11/series 2.   Electronically Signed   By: Signa Kell M.D.   On: 12/31/2014 17:00   12/31/2014   CLINICAL DATA:  Patient was admitted 9 days ago with stroke-like symptoms. Now unresponsive with left-sided facial droop.  EXAM: CT HEAD WITHOUT CONTRAST  TECHNIQUE: Contiguous axial images were obtained from the base of the skull through the  vertex without intravenous contrast.  COMPARISON:  MRI from 12/23/2014  FINDINGS: There is a large area of low attenuation within the right parietal lobe and posterior frontal lobe compatible with subacute infarct. This is a new abnormality from the previous study, image 21/series 2. There is mild mass effect with right to left midline shaft  measuring 3 mm, image 18 of series 2. Patchy low attenuation throughout the subcortical and periventricular white matter is noted compatible with chronic microvascular disease. There is prominence of the sulci and ventricles consistent with brain atrophy. No evidence for acute intracranial hemorrhage or mass. There is moderate mucosal thickening involving the paranasal sinuses. Fluid levels identified within the sphenoid sinus. The mastoid air cells are clear. The calvarium appears intact.  IMPRESSION: 1. New, large area of low attenuation involving the posterior right frontal and anterior parietal lobe. This is compatible with a subacute right MCA distribution infarct. This is a new abnormality from brain MRI dated 12/23/2014. 2. No evidence for acute intracranial hemorrhage. 3. Pan sinus inflammation.  Electronically Signed: By: Signa Kellaylor  Stroud M.D. On: 12/31/2014 16:51    Assessment/Plan: 69 y.o. male admitted 5/15 with CVA, followed by a seizure, requiring intubation. He has developed PNA with MSSA on culture, as well as enterococcal bacteremia. He has a flexiseal in place and a central line. Remains intubated but not on any pressors. Has been febrile and with elevated WBC. Likely fevers due to the enterococcal bacteremia as recent abx had not covered this. Fevers improved but now with new larger CVA  MSSA PNA - continue vanco (on for enterococcal bacteremia in setting of pcn allergy) and ancef as this will provide better MSSA coverage  Enterococcal bacteremia- has line in place (placed 5/18 per report) - TTE neg but done 5./16 and bcx + from 5/20 Consider remove  Central line -   Lucas Ortega   01/02/2015, 12:44 PM

## 2015-01-02 NOTE — Progress Notes (Signed)
*  PRELIMINARY RESULTS* Echocardiogram 2D Echocardiogram has been performed.  Lucas Ortega 01/02/2015, 2:28 PM

## 2015-01-02 NOTE — Plan of Care (Signed)
Could not complete the stroke care plan due to patient being on vent smb 01/02/15

## 2015-01-02 NOTE — Progress Notes (Signed)
Pt remains sedated and intubated, loose stool noted, flexiseal and foley remain intact. No changes noted during this shift.

## 2015-01-03 ENCOUNTER — Inpatient Hospital Stay: Payer: Medicare Other

## 2015-01-03 DIAGNOSIS — B952 Enterococcus as the cause of diseases classified elsewhere: Secondary | ICD-10-CM

## 2015-01-03 DIAGNOSIS — R7881 Bacteremia: Secondary | ICD-10-CM

## 2015-01-03 DIAGNOSIS — A4901 Methicillin susceptible Staphylococcus aureus infection, unspecified site: Secondary | ICD-10-CM

## 2015-01-03 DIAGNOSIS — J15211 Pneumonia due to Methicillin susceptible Staphylococcus aureus: Secondary | ICD-10-CM

## 2015-01-03 LAB — CBC
HCT: 21.1 % — ABNORMAL LOW (ref 40.0–52.0)
HEMOGLOBIN: 6.7 g/dL — AB (ref 13.0–18.0)
MCH: 25.3 pg — ABNORMAL LOW (ref 26.0–34.0)
MCHC: 31.6 g/dL — ABNORMAL LOW (ref 32.0–36.0)
MCV: 80.2 fL (ref 80.0–100.0)
Platelets: 360 10*3/uL (ref 150–440)
RBC: 2.63 MIL/uL — ABNORMAL LOW (ref 4.40–5.90)
RDW: 18.9 % — ABNORMAL HIGH (ref 11.5–14.5)
WBC: 14 10*3/uL — ABNORMAL HIGH (ref 3.8–10.6)

## 2015-01-03 LAB — BASIC METABOLIC PANEL WITH GFR
Anion gap: 8 (ref 5–15)
BUN: 27 mg/dL — ABNORMAL HIGH (ref 6–20)
CO2: 28 mmol/L (ref 22–32)
Calcium: 8.1 mg/dL — ABNORMAL LOW (ref 8.9–10.3)
Chloride: 108 mmol/L (ref 101–111)
Creatinine, Ser: 1.58 mg/dL — ABNORMAL HIGH (ref 0.61–1.24)
GFR calc Af Amer: 50 mL/min — ABNORMAL LOW
GFR calc non Af Amer: 43 mL/min — ABNORMAL LOW
Glucose, Bld: 119 mg/dL — ABNORMAL HIGH (ref 65–99)
Potassium: 3.6 mmol/L (ref 3.5–5.1)
Sodium: 144 mmol/L (ref 135–145)

## 2015-01-03 LAB — PHOSPHORUS: Phosphorus: 5.9 mg/dL — ABNORMAL HIGH (ref 2.5–4.6)

## 2015-01-03 LAB — GLUCOSE, CAPILLARY: Glucose-Capillary: 101 mg/dL — ABNORMAL HIGH (ref 65–99)

## 2015-01-03 LAB — MAGNESIUM: Magnesium: 1.8 mg/dL (ref 1.7–2.4)

## 2015-01-03 MED ORDER — DEXMEDETOMIDINE HCL IN NACL 400 MCG/100ML IV SOLN: 1.5000 ug/kg/h | INTRAVENOUS | Status: DC

## 2015-01-03 MED ORDER — SODIUM CHLORIDE 0.9 % IV SOLN: INTRAVENOUS | Status: DC

## 2015-01-03 MED ORDER — HALOPERIDOL LACTATE 2 MG/ML PO CONC
0.5000 mg | ORAL | Status: DC | PRN
  Filled 2015-01-03: qty 0.3

## 2015-01-03 MED ORDER — MORPHINE SULFATE 4 MG/ML IJ SOLN
4.0000 mg | INTRAMUSCULAR | Status: DC | PRN
  Administered 2015-01-03: 4 mg via INTRAVENOUS
  Filled 2015-01-03 (×3): qty 1

## 2015-01-03 MED ORDER — GLYCOPYRROLATE NICU ORAL SYRINGE 0.2 MG/ML
0.2000 mg | Freq: Once | ORAL | Status: DC
  Filled 2015-01-03: qty 1

## 2015-01-03 MED ORDER — GLYCOPYRROLATE 0.2 MG/ML IJ SOLN: 0.2000 mg | INTRAMUSCULAR | Status: DC | PRN

## 2015-01-03 MED ORDER — HALOPERIDOL LACTATE 5 MG/ML IJ SOLN: 0.5000 mg | INTRAMUSCULAR | Status: DC | PRN

## 2015-01-03 MED ORDER — LORAZEPAM 2 MG/ML IJ SOLN: 2.0000 mg | Freq: Once | INTRAMUSCULAR | Status: DC

## 2015-01-03 MED ORDER — LORAZEPAM 2 MG/ML IJ SOLN
1.0000 mg | INTRAMUSCULAR | Status: DC | PRN
  Filled 2015-01-03: qty 1

## 2015-01-03 MED ORDER — ACETAMINOPHEN 325 MG PO TABS: 650.0000 mg | ORAL_TABLET | Freq: Four times a day (QID) | ORAL | Status: DC | PRN

## 2015-01-03 MED ORDER — ACETAMINOPHEN 650 MG RE SUPP: 650.0000 mg | Freq: Four times a day (QID) | RECTAL | Status: DC | PRN

## 2015-01-03 MED ORDER — GLYCOPYRROLATE 0.2 MG/ML IJ SOLN
0.2000 mg | Freq: Once | INTRAMUSCULAR | Status: AC
  Administered 2015-01-03: 0.2 mg via INTRAVENOUS
  Filled 2015-01-03: qty 1

## 2015-01-03 MED ORDER — LORAZEPAM 2 MG/ML PO CONC
1.0000 mg | ORAL | Status: DC | PRN
  Filled 2015-01-03: qty 0.5

## 2015-01-03 MED ORDER — LORAZEPAM 0.5 MG PO TABS: 1.0000 mg | ORAL_TABLET | ORAL | Status: DC | PRN

## 2015-01-03 MED ORDER — HALOPERIDOL 0.5 MG PO TABS
0.5000 mg | ORAL_TABLET | ORAL | Status: DC | PRN
  Filled 2015-01-03: qty 1

## 2015-01-07 LAB — CULTURE, BLOOD (ROUTINE X 2)
CULTURE: NO GROWTH
Culture: NO GROWTH

## 2015-01-08 NOTE — Progress Notes (Signed)
Tried to wean off of Precedex, when drip was turned off pt. Was getting increasingly agitated with and increased HR and RR. Restarted Precedex.

## 2015-01-08 NOTE — Discharge Summary (Signed)
This is a death summary on Lucas Ortega  Patient Active Problem List   Diagnosis Date Noted  . MSSA (methicillin susceptible Staphylococcus aureus) pneumonia 01/06/2015  . Bacteremia due to Enterococcus 12/20/2014  . Acute respiratory failure 01/02/2015  . Aspiration into airway   . CVA (cerebral vascular accident)   . CVA (cerebral infarction) 2015-01-02  . Seizure disorder 2015-01-02  . HTN (hypertension) 2015-01-02  . Gout 2015-01-02   Treatment Team:  Mellody DrownMatthew Smith, MD Erin FullingKurian Kasa, MD [place] Cherylann RatelLateef, MD Renford DillsGregory G Schnier, MD Gale Journeyatherine P Walsh, MD Clydie Braunavid Fitzgerald, MD    Hospital course  This is 69 year old male with a history of hypertension and gout he was found by his family with left facial droop and left-sided weakness. He also has been drinking EtOH heavily. He had a seizure in the ER.  * Seizure: She likely had a seizure secondary to the acute stroke. Patient was initially on Keppra and then weaned off of it. Patient has not had any further seizures.  * sepsis - this was likely secondary to MSSA pneumonia, and enterococcal bacteremia. - Initially patient was hemodynamically unstable and therefore wasn't IV fluids and IV vasopressors and then weaned off of it. Patient had echocardiogram done which showed no evidence of endocarditis. - She was seen by infectious disease and maintained on IV antibiotics with vancomycin and Ancef.  * acute respiratory failure due to Aspiration pneumonia - patient developed an aspiration pneumonia shortly after this seizure and the stroke. Patient was maintained on the ventilator while in the hospital. Pulmonary followed the patient and helped manage the ventilator. - Patient was unable to come off the ventilator given his multiple comorbidities and his mental status.    * Stroke:On admission patient presented with a right pontine infarct.  Patient was seen by neurology and he recommended continuing medical management with aspirin and  statin - Patient had a repeat CT done which showed a new subacute stroke.  As per neurology continue stroke on his CT was in a different territory. Therefore there was some concern for possible sepsis and endocarditis and he recommended getting a TEE.  Patient was maintained on aggressive care with broad-spectrum IV antibiotics, vent support, vasopressors.  Palliative Care consult was obtained to discuss goals of care with the family and they want to pursue aggressive treatment for a few days to see if the patient's clinical symptoms would improve. As mentioned above patient was maintained on aggressive treatment although the clinical symptoms were not improving. There was a family meeting on 01/07/2015. After the family meeting the family wanted to pursue comfort care and therefore patient was taken off life support.  Patient shortly thereafter passed away on the late morning of 01/02/2015.  The family was made aware.

## 2015-01-08 NOTE — Progress Notes (Signed)
Nutrition Brief Note  Chart reviewed. Tube feeding stopped.  Pt now transitioning to comfort care. S/p extubation. Pallative care following.  No further nutrition interventions warranted at this time.  Please re-consult as needed.   Chyenne Sobczak B. Freida BusmanAllen, RD, LDN [phone] (pager)

## 2015-01-08 NOTE — Consult Note (Signed)
I met with pt's family including daughter and sister, Enid Derry, who is pt's Marine scientist. Again discussed the options of trach/PEG/LTACH vs extubation to comfort care. After lengthy discussion, family agree with extubation to comfort care. Orders entered. I was present for extubation. Family now at bedside.

## 2015-01-08 NOTE — Consult Note (Signed)
Palliative Medicine Inpatient Consult Follow Up Note   Name: Lucas Ortega Date: 2015-08-07 MRN: [id number]  DOB: 02-Mar-1946  Referring Physician: Houston SirenVivek J Sainani, MD  Palliative Care consult requested for this 69 y.o. male for goals of medical therapy in patient with HTN, HLD, DM, ETOH abuse admitted with altered mental status now with VDRF & acute CVA  Pt is lying in bed in CCU, on vent, sedated but eyes open and appears agitated. Daughter at bedside.  REVIEW OF SYSTEMS:  Patient is not able to provide ROS  CODE STATUS: Full code   PAST MEDICAL HISTORY: Past Medical History  Diagnosis Date  . Hypertension   . Hyperlipidemia   . Arthritis   . Gout     PAST SURGICAL HISTORY: History reviewed. No pertinent past surgical history.  Vital Signs: BP 106/63 mmHg  Pulse 63  Temp(Src) 98.5 F (36.9 C) (Axillary)  Resp 16  Ht 5\' 6"  (1.676 m)  Wt 131.4 kg (289 lb 11 oz)  BMI 46.78 kg/m2  SpO2 96% Filed Weights   01/01/15 0500 01/02/15 0645 03-26-2015 0800  Weight: 125.1 kg (275 lb 12.7 oz) 131.7 kg (290 lb 5.5 oz) 131.4 kg (289 lb 11 oz)    Estimated body mass index is 46.78 kg/(m^2) as calculated from the following:   Height as of this encounter: 5\' 6"  (1.676 m).   Weight as of this encounter: 131.4 kg (289 lb 11 oz).  PHYSICAL EXAM: General: Critically ill appearing HEENT: ETT/OGT in place Neck: Trachea midline  Cardiovascular: regular rate and rhythm Pulmonary/Chest: Poor air movemnt ant fields, no audible wheeze Abdominal: Soft, nontender, hypoactive bowel sounds GU: Foley present, clear yellow urine Extremities: + edema BLE's Neurological: eyes open, L.eye deviation to L, moves RUE > RLE, flaccid L.UE, does not follow commands Skin: no rashes Psychiatric: unable to assess  LABS: CBC:    Component Value Date/Time   WBC 14.0* 02016-12-29 0536   HGB 6.7* 02016-12-29 0536   HCT 21.1* 02016-12-29 0536   PLT 360 02016-12-29 0536   MCV 80.2 02016-12-29 0536   NEUTROABS  23.5* 12/18/2014 1444   LYMPHSABS 1.9 01/02/2015 1444   MONOABS 2.2* 12/30/2014 1444   EOSABS 0.0 12/11/2014 1444   BASOSABS 0.0 12/21/2014 1444   Comprehensive Metabolic Panel:    Component Value Date/Time   NA 144 02016-12-29 0536   K 3.6 02016-12-29 0536   CL 108 02016-12-29 0536   CO2 28 02016-12-29 0536   BUN 27* 02016-12-29 0536   CREATININE 1.58* 02016-12-29 0536   GLUCOSE 119* 02016-12-29 0536   CALCIUM 8.1* 02016-12-29 0536   AST 107* 12/23/2014 0408   ALT 29 12/23/2014 0408   ALKPHOS 67 12/23/2014 0408   BILITOT 1.4* 12/23/2014 0408   PROT 6.5 12/23/2014 0408   ALBUMIN 2.2* 12/29/2014 0515    IMPRESSION: Lucas Ortega is a 69 y.o. Man with HTN, HLD, DM, gout, OA and ongoing ETOH abuse. He was admitted 01/01/2015 with altered mental status and L facial droop.MRI shows R pontine stroke.Hospital course complicated by VDRF. Sputum + for MSSA. 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   Awaiting family to arrive for meeting to discuss goals of therapy.   PLAN: Family meeting  REFERRALS TO BE ORDERED:  Chaplain   More than 50% of the visit was spent in counseling/coordination of care: YES  Time spent: 40 minutes

## 2015-01-08 NOTE — Progress Notes (Addendum)
Patient extubated to room air, per Dr. Donnald GarrePfeiffer,  patient now on comfort care.  Pt was suctioned prior to extubation, tolerated procedure well.

## 2015-01-08 NOTE — Progress Notes (Signed)
Noted at this time, that patients flexiseal not in place, during bathing pt was noted that pt coughed up ogt, new ng placed and xray ordered, new flexiseal placed at this time.

## 2015-01-08 NOTE — Progress Notes (Signed)
   01/01/2015 1305  Clinical Encounter Type  Visited With Patient and family together  Visit Type Follow-up;Critical Care;Death  Referral From Chaplain  Spiritual Encounters  Spiritual Needs Emotional;Grief support  Stress Factors  Patient Stress Factors Health changes  Family Stress Factors Health changes;Loss  Chaplain was paged to offer a compassionate presence and emotional support to patient and family as he transitioned. Chaplain Loreena Valeri A. Ewart Carrera Ext. [phone]

## 2015-01-08 NOTE — Progress Notes (Signed)
Pts family made the decision to proceed with comfort care and pt was extubated. Multiple family members were present with the pt. Through the end of life. Cleda ClarksSandra Borba and Corrin Parkerhristina Miguelina Fore independently auscultated and palpated asystole on cardiac rhythm and cessation of respirations and pronounced the pt deceased at 1241. Dr. Harvie JuniorPhifer and Dr. Cherlynn KaiserSainani, supervisor and [place] [facility] were notified. At this time pt. Is not an eligible donor.

## 2015-01-08 NOTE — Consult Note (Signed)
PULMONARY / CRITICAL CARE MEDICINE   Name: Lucas Ortega MRN: [id number] DOB: 1946/07/22    ADMISSION DATE:  12/26/2014   CHIEF COMPLAINT:   Acute CVA with inability to clear secretions   SUBJECTIVE   Patient remains intubated,patient has failed multiple failed wean attempts due to resp muscle fatigue Blood cultures + E. Faecalis Sputum cultures + Satph aureaus  Patient with acute CVA RT, new since admission -patient requires intubation on Vent for air instability Prognosis is poor  If family wishes to pursue aggressive medical care-then recommend proceeding with [facility] and PEG tube       PAST MEDICAL HISTORY    :  Past Medical History  Diagnosis Date  . Hypertension   . Hyperlipidemia   . Arthritis   . Gout    History reviewed. No pertinent past surgical history. Prior to Admission medications   Medication Sig Start Date End Date Taking? Authorizing Provider  amLODipine (NORVASC) 5 MG tablet Take 5 mg by mouth daily.   Yes Historical Provider, MD  colchicine 0.6 MG tablet Take 0.6 mg by mouth daily.   Yes Historical Provider, MD  lisinopril (PRINIVIL,ZESTRIL) 40 MG tablet Take 40 mg by mouth daily.   Yes Historical Provider, MD  Multiple Vitamins-Minerals (CENTRUM SILVER ADULT 50+) TABS Take 1 tablet by mouth daily.   Yes Historical Provider, MD  vitamin B-12 (CYANOCOBALAMIN) 1000 MCG tablet Take 1,000 mcg by mouth daily.   Yes Historical Provider, MD   Allergies  Allergen Reactions  . Penicillins Other (See Comments)    Unknown reaction     FAMILY HISTORY   Family History  Problem Relation Age of Onset  . Family history unknown: Yes      SOCIAL HISTORY    reports that he has never smoked. He does not have any smokeless tobacco history on file. He reports that he drinks alcohol. He reports that he does not use illicit drugs.  Review of Systems  Unable to perform ROS: critical illness      VITAL SIGNS    Temp:  [98.5 F (36.9 C)-100.1 F  (37.8 C)] 98.5 F (36.9 C) (05/27 0800) Pulse Rate:  [63-116] 63 (05/27 0800) Resp:  [12-32] 16 (05/27 0800) BP: (106-193)/(59-101) 106/63 mmHg (05/27 0800) SpO2:  [85 %-100 %] 96 % (05/27 0800) FiO2 (%):  [24 %] 24 % (05/27 0825) Weight:  [289 lb 11 oz (131.4 kg)] 289 lb 11 oz (131.4 kg) (05/27 0800) HEMODYNAMICS:   VENTILATOR SETTINGS: Vent Mode:  [-] PRVC FiO2 (%):  [24 %] 24 % Set Rate:  [16 bmp] 16 bmp Vt Set:  [500 mL] 500 mL PEEP:  [5 cmH20] 5 cmH20 Pressure Support:  [15 cmH20] 15 cmH20 INTAKE / OUTPUT:  Intake/Output Summary (Last 24 hours) at Feb 02, 2015 0930 Last data filed at 02-02-2015 0600  Gross per 24 hour  Intake 1516.24 ml  Output   2651 ml  Net -1134.76 ml       PHYSICAL EXAM   Physical Exam  Constitutional: No distress.  Increased oral secretions  HENT:  Head: Normocephalic and atraumatic.  Eyes: Pupils are equal, round, and reactive to light. No scleral icterus.  Neck: Normal range of motion. Neck supple.  Cardiovascular: Normal rate and regular rhythm.   No murmur heard. Pulmonary/Chest: No respiratory distress. He has no wheezes. He has rales.  resp distress  Abdominal: Soft. He exhibits no distension. There is no tenderness.  Musculoskeletal: He exhibits no edema.  Neurological: He displays normal reflexes. Coordination  normal.  Gcs<8T   Skin: Skin is warm. No rash noted. He is not diaphoretic.       LABS   LABS:  CBC  Recent Labs Lab 01/01/15 0345 01/02/15 0428 12/23/2014 0536  WBC 14.3* 14.1* 14.0*  HGB 7.2* 7.3* 6.7*  HCT 22.6* 22.7* 21.1*  PLT 249 302 360   Coag's  Recent Labs Lab 12/28/14 0517  APTT 36   BMET  Recent Labs Lab 01/01/15 0345 01/02/15 0428 12/27/2014 0536  NA 142 142 144  K 3.5 3.1* 3.6  CL 107 107 108  CO2 26 27 28   BUN 16 23* 27*  CREATININE 1.84* 1.72* 1.58*  GLUCOSE 121* 144* 119*   Electrolytes  Recent Labs Lab 12/31/14 0515 01/01/15 0345 01/02/15 0428 12/27/2014 0536  CALCIUM  7.9* 8.3* 8.0* 8.1*  MG 2.0  --  1.7 1.8  PHOS 3.8  --  4.6 5.9*   Sepsis Markers No results for input(s): LATICACIDVEN, PROCALCITON, O2SATVEN in the last 168 hours. ABG No results for input(s): PHART, PCO2ART, PO2ART in the last 168 hours. Liver Enzymes  Recent Labs Lab 12/28/14 0517 12/28/14 1039 12/29/14 0515  ALBUMIN 2.3* 2.3* 2.2*   Cardiac Enzymes No results for input(s): TROPONINI, PROBNP in the last 168 hours. Glucose  Recent Labs Lab 01/02/15 0355 01/02/15 1152 01/02/15 1635 01/02/15 2020 01/02/15 2349 01/07/2015 0750  GLUCAP 124* 139* 127* 135* 134* 101*     Recent Results (from the past 240 hour(s))  Culture, blood (routine x 2)     Status: None   Collection Time: 12/27/14  8:58 AM  Result Value Ref Range Status   Specimen Description BLOOD  Final   Special Requests Normal  Final   Culture  Setup Time   Final    GRAM POSITIVE COCCI ANAEROBIC BOTTLE ONLY CRITICAL RESULT CALLED TO, READ BACK BY AND VERIFIED WITH: CALLED TO JULIE BRADDY AT 0250 ON 12/28/14/[facility] CONFIRMED BY PH    Culture ENTEROCOCCUS FAECALIS ANAEROBIC BOTTLE ONLY   Final   Report Status 01/01/2015 FINAL  Final   Organism ID, Bacteria ENTEROCOCCUS FAECALIS  Final      Susceptibility   Enterococcus faecalis - MIC*    AMPICILLIN <=2 SENSITIVE Sensitive     LINEZOLID 2 SENSITIVE Sensitive     * ENTEROCOCCUS FAECALIS  Culture, blood (routine x 2)     Status: None   Collection Time: 12/27/14  9:00 AM  Result Value Ref Range Status   Specimen Description BLOOD  Final   Special Requests Normal  Final   Culture NO GROWTH 5 DAYS  Final   Report Status 01/01/2015 FINAL  Final  Culture, expectorated sputum-assessment     Status: None (Preliminary result)   Collection Time: 12/27/14 11:08 AM  Result Value Ref Range Status   Specimen Description SPUTUM  Final   Special Requests Normal  Final   Sputum evaluation THIS SPECIMEN IS ACCEPTABLE FOR SPUTUM CULTURE  Final   Report Status PENDING   Incomplete  Culture, respiratory (NON-Expectorated)     Status: None   Collection Time: 12/27/14 11:08 AM  Result Value Ref Range Status   Specimen Description SPUTUM  Final   Special Requests Normal Reflexed from [id number]  Final   Gram Stain   Final    FEW WBC SEEN FEW GRAM POSITIVE COCCI IN CLUSTERS GOOD SPECIMEN - 80-90% WBCS    Culture MODERATE GROWTH STAPHYLOCOCCUS AUREUS  Final   Report Status 12/30/2014 FINAL  Final   Organism ID, Bacteria STAPHYLOCOCCUS  AUREUS  Final      Susceptibility   Staphylococcus aureus - MIC*    CIPROFLOXACIN <=0.5 SENSITIVE Sensitive     ERYTHROMYCIN <=0.25 SENSITIVE Sensitive     GENTAMICIN <=0.5 SENSITIVE Sensitive     OXACILLIN 0.5 SENSITIVE Sensitive     TETRACYCLINE <=1 SENSITIVE Sensitive     TRIMETH/SULFA <=10 SENSITIVE Sensitive     CLINDAMYCIN <=0.25 SENSITIVE Sensitive     CEFOXITIN SCREEN NEGATIVE Sensitive     Inducible Clindamycin NEGATIVE Sensitive     * MODERATE GROWTH STAPHYLOCOCCUS AUREUS  Culture, blood (routine x 2)     Status: None (Preliminary result)   Collection Time: 01/01/15 11:54 PM  Result Value Ref Range Status   Specimen Description BLOOD  Final   Special Requests NONE  Final   Culture NO GROWTH 1 DAY  Final   Report Status PENDING  Incomplete  Culture, blood (routine x 2)     Status: None (Preliminary result)   Collection Time: 01/01/15 11:54 PM  Result Value Ref Range Status   Specimen Description BLOOD  Final   Special Requests NONE  Final   Culture NO GROWTH 1 DAY  Final   Report Status PENDING  Incomplete      IMAGING    Dg Abd Portable 1v  01/07/2015   CLINICAL DATA:  NG tube placement  EXAM: PORTABLE ABDOMEN - 1 VIEW  COMPARISON:  12/29/2014  FINDINGS: Examination is markedly degraded due to patient body habitus and portable technique.  Enteric tube tip and side port project over the expected location of the gastric antrum. Paucity of bowel gas without evidence of obstruction  IMPRESSION: Enteric tube  tip and side port project over the expected location of the gastric antrum.   Electronically Signed   By: Simonne Come M.D.   On: 01/02/2015 02:48        ASSESSMENT/PLAN   69 yo AAm admitted to ICU for acute CVA with inability to clear secretions intubated for resp failure. Acute DT's. With severe sepsis from E Faecalis bacteremia and Staph aureus pneumonia  PULMONARY-unable to protect airway due to RT CVA -recommend TRACH and PEG tubes if family wishes to pursue further care -Respiratory Failure -continue Full MV support -continue Bronchodilator Therapy  CARDIOVASCULAR -needs ICU monitoring  RENAL -watch UO  GASTROINTESTINAL -Needs PEG tube for survival-will re-consult GI if needed -on  OG tube feeds  HEMATOLOGIC -follow h/h  INFECTIOUS-Staph aureus/ASPIRATION PNEUMONIA E faecalis bacteremia -follow up ID consult  ENDOCRINE Follow FSBS  NEUROLOGIC -acute CVA -assess underlying neuro status   Palliative care team consulted, family discussion pending    I have personally obtained a history, examined the patient, evaluated laboratory and imaging results, formulated the assessment and plan and placed orders.  The Patient requires high complexity decision making for assessment and support, frequent evaluation and titration of therapies, application of advanced monitoring technologies and extensive interpretation of multiple databases. Critical Care Time devoted to patient care services described in this note is 40 minutes.   Overall, patient is critically ill, prognosis is guarded. Patient at high risk for cardiac arrest and death.   Lucie Leather, M.D. Pulmonary & Critical Care Medicine [facility] Medical Director Intensive Care Unit   01/02/2015, 9:30 AM

## 2015-01-08 DEATH — deceased

## 2015-01-23 LAB — EXPECTORATED SPUTUM ASSESSMENT W GRAM STAIN, RFLX TO RESP C: Special Requests: NORMAL

## 2016-09-28 IMAGING — DX DG CHEST 2V
2 series · 2 of 2 positions shown · non-contrast
Comparison: None.

CLINICAL DATA: Possible aspiration.  Abnormal lung sounds.

EXAM:
CHEST  2 VIEW

[chest lat]
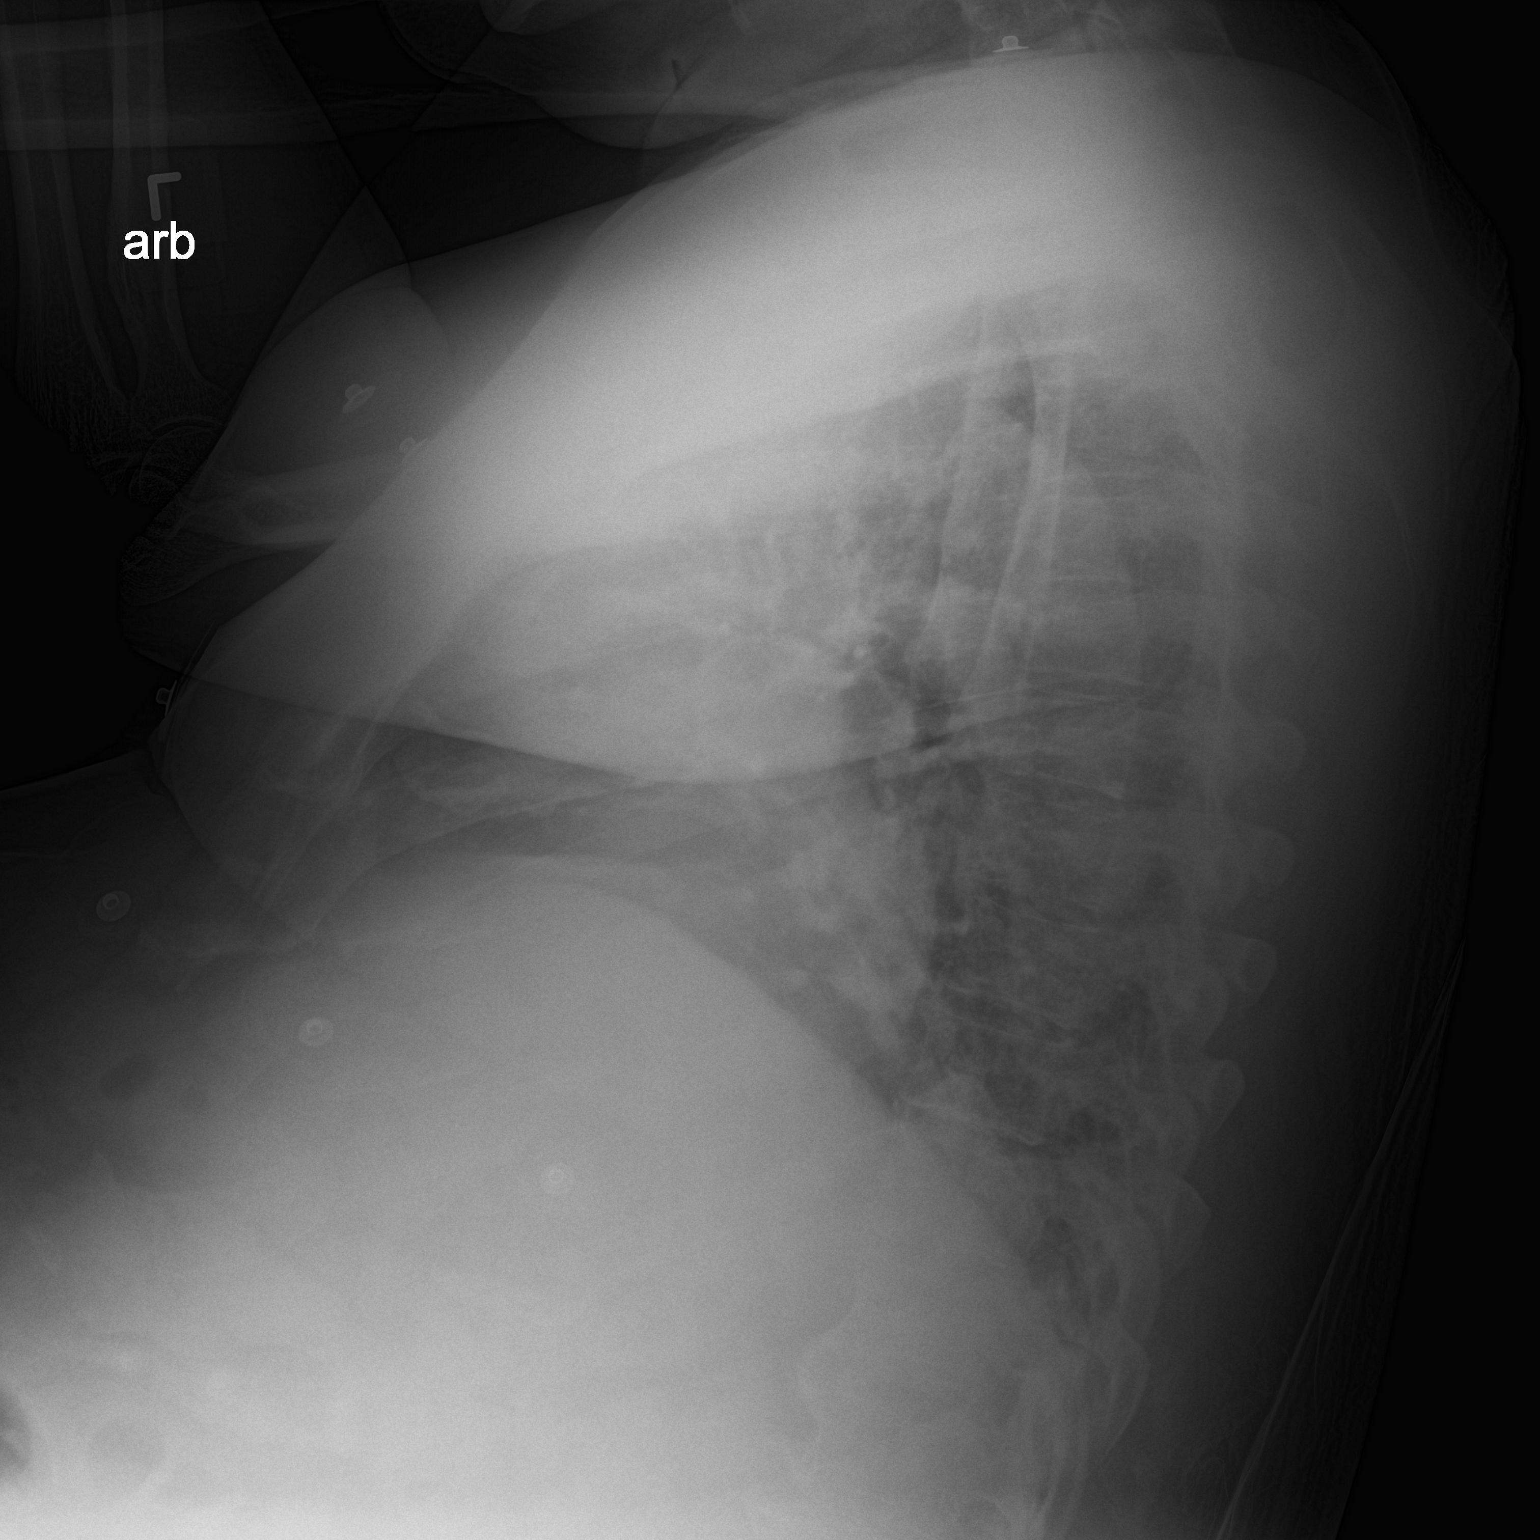

[chest ap]
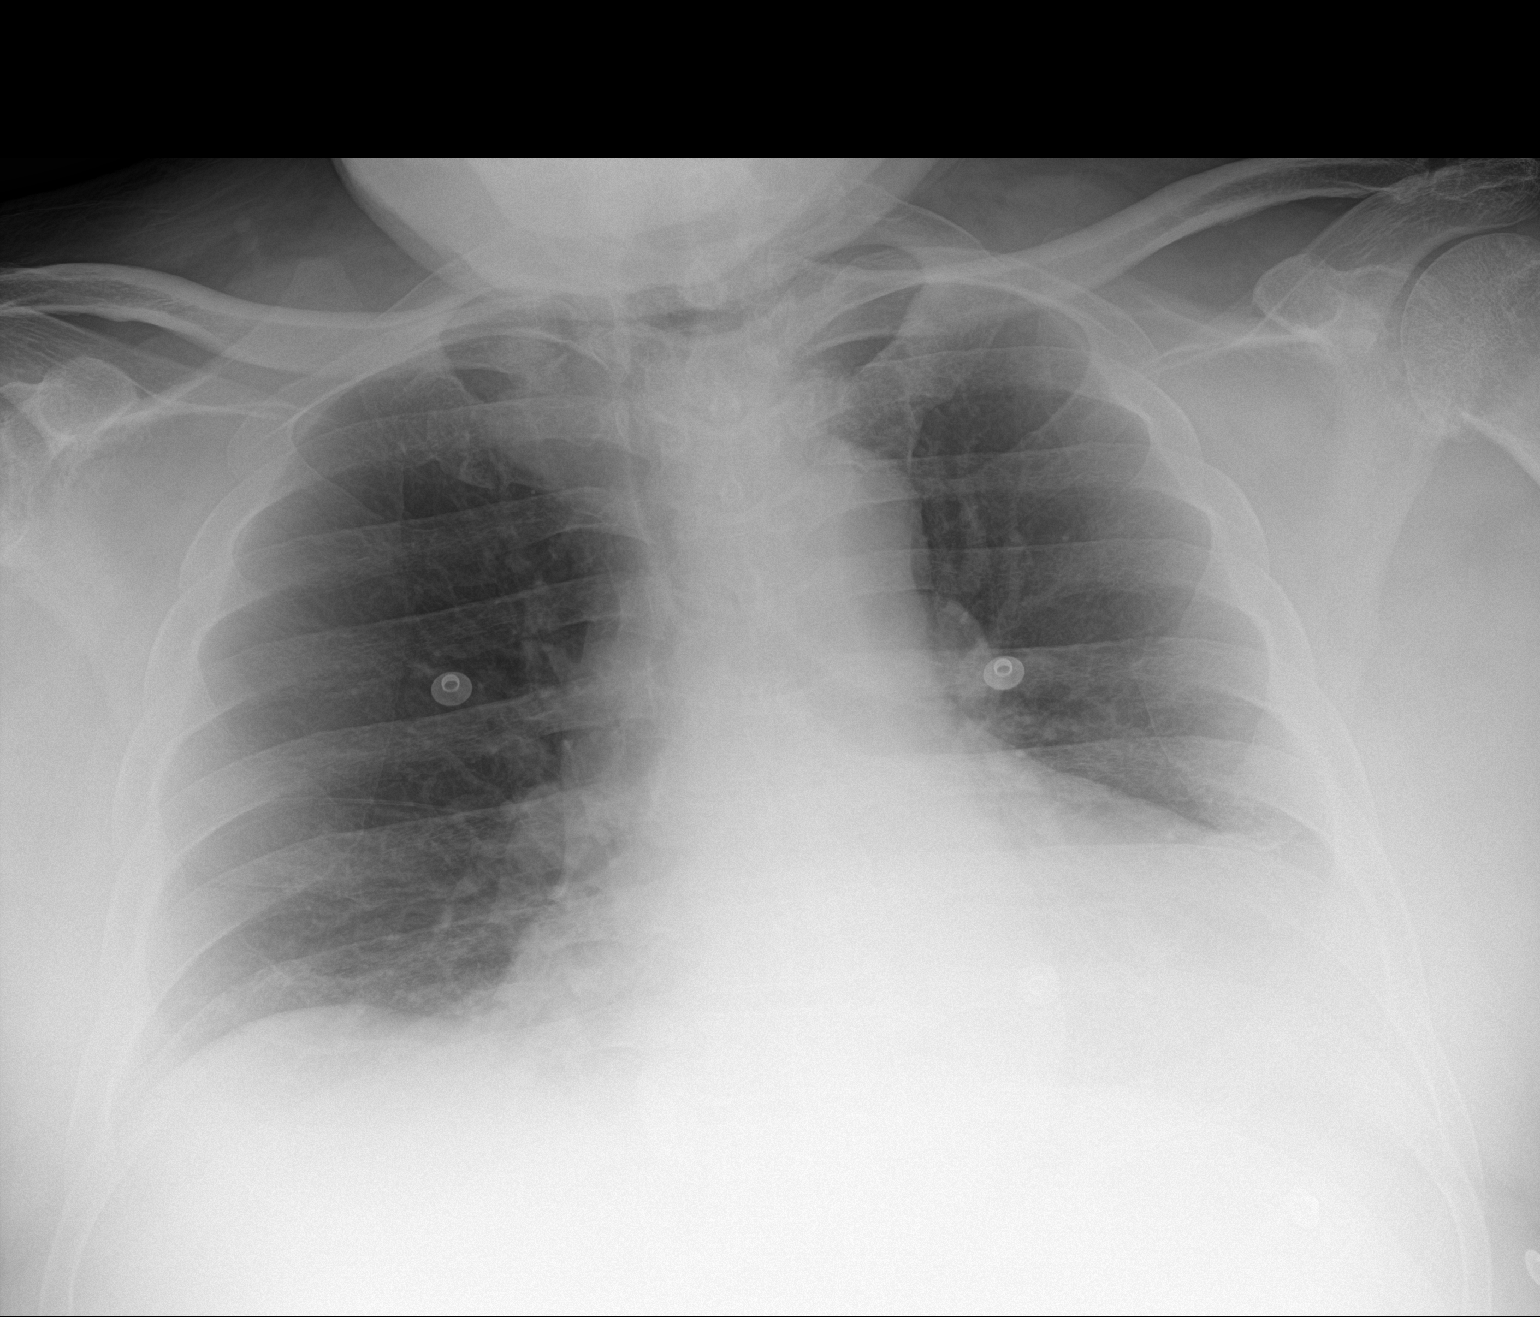

[2 of 2 positions shown; findings below may reference images not displayed]

FINDINGS: The heart size and mediastinal contours are within normal limits.
Both lungs are clear. No pneumothorax or pleural effusion is noted.
The visualized skeletal structures are unremarkable.
IMPRESSION: No active cardiopulmonary disease.

## 2016-09-30 IMAGING — CR DG CHEST 1V
1 series · 1 of 1 positions shown · non-contrast
Comparison: 12/23/2014.

CLINICAL DATA: Aspiration into airway.

EXAM:
CHEST  1 VIEW

[ap]
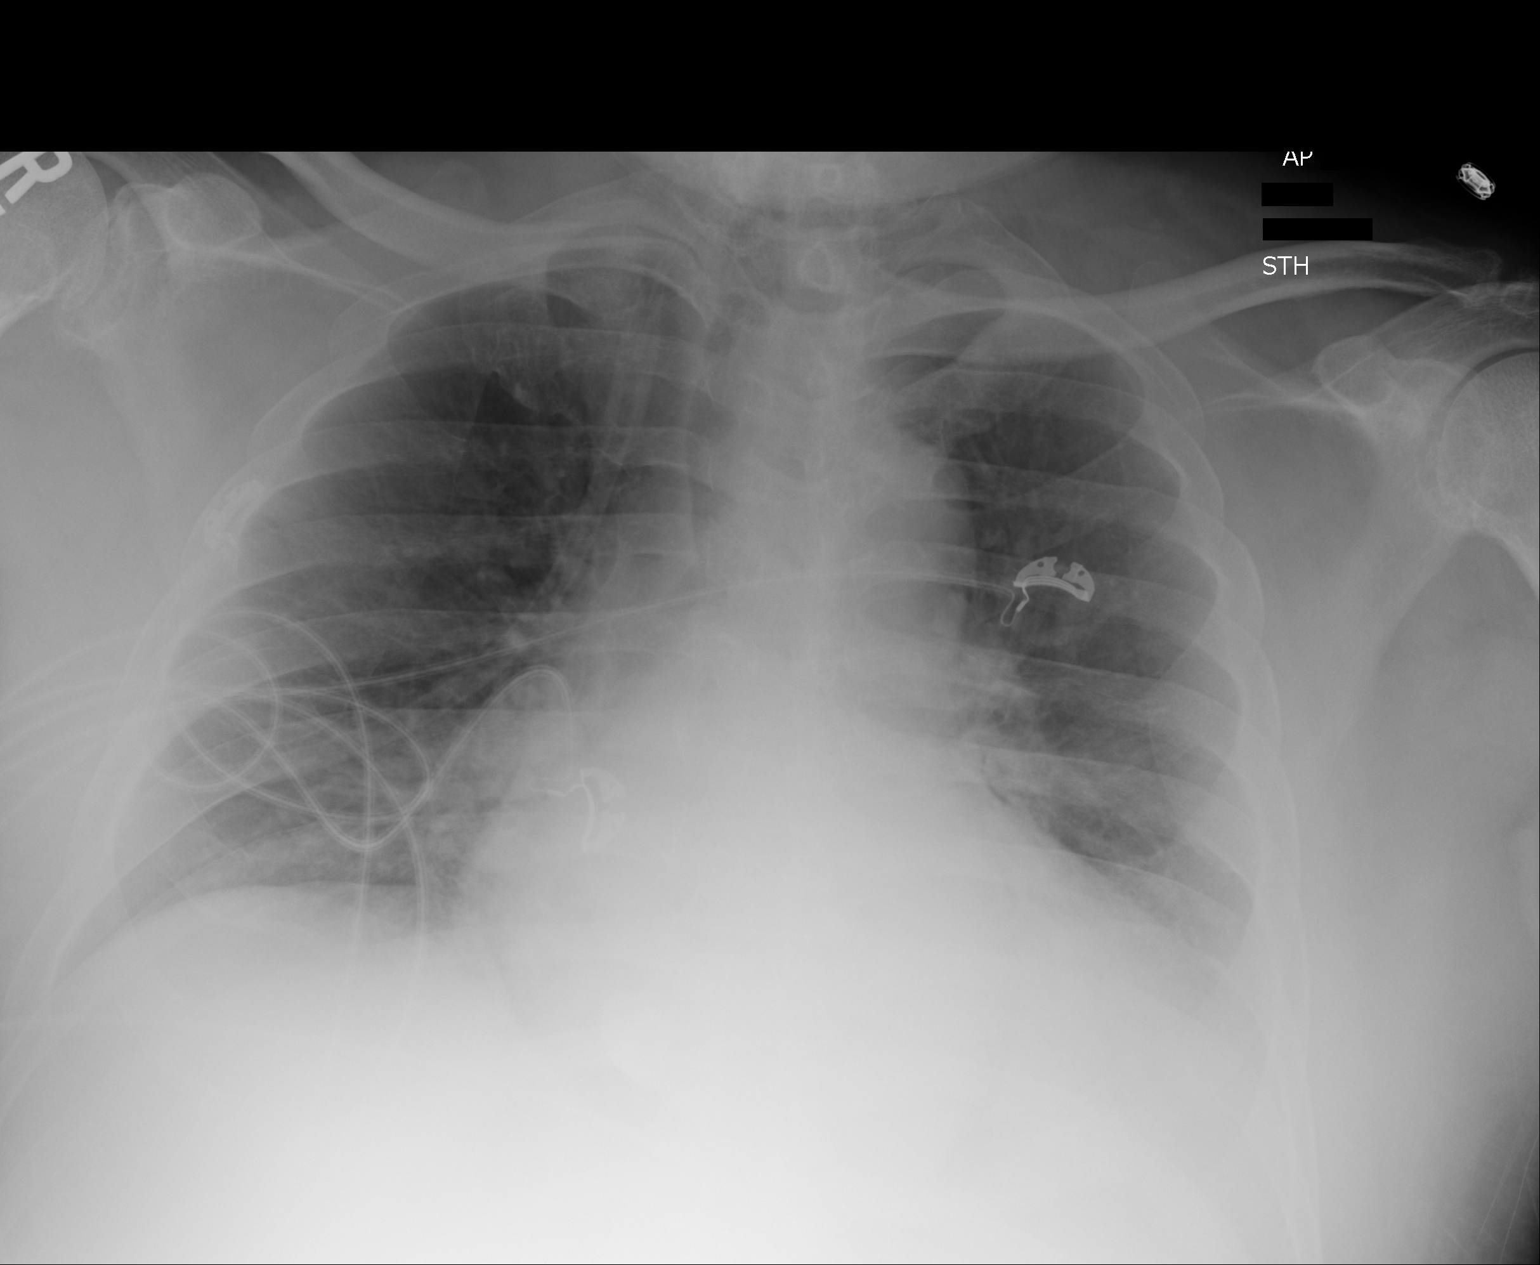

[1 of 1 positions shown; findings below may reference images not displayed]

FINDINGS: Poor inspiration. Grossly stable enlarged cardiac silhouette. Mild
patchy opacity at both lung bases.
IMPRESSION: 1. Poor inspiration with mild patchy atelectasis at both lung bases.
Aspiration pneumonitis is less likely.
2. Grossly stable cardiomegaly.

## 2016-10-01 IMAGING — CR DG CHEST 1V PORT
1 series · 1 of 1 positions shown · non-contrast
Comparison: 12/24/2014.

CLINICAL DATA: Intubation.

EXAM:
PORTABLE CHEST - 1 VIEW

[ap]
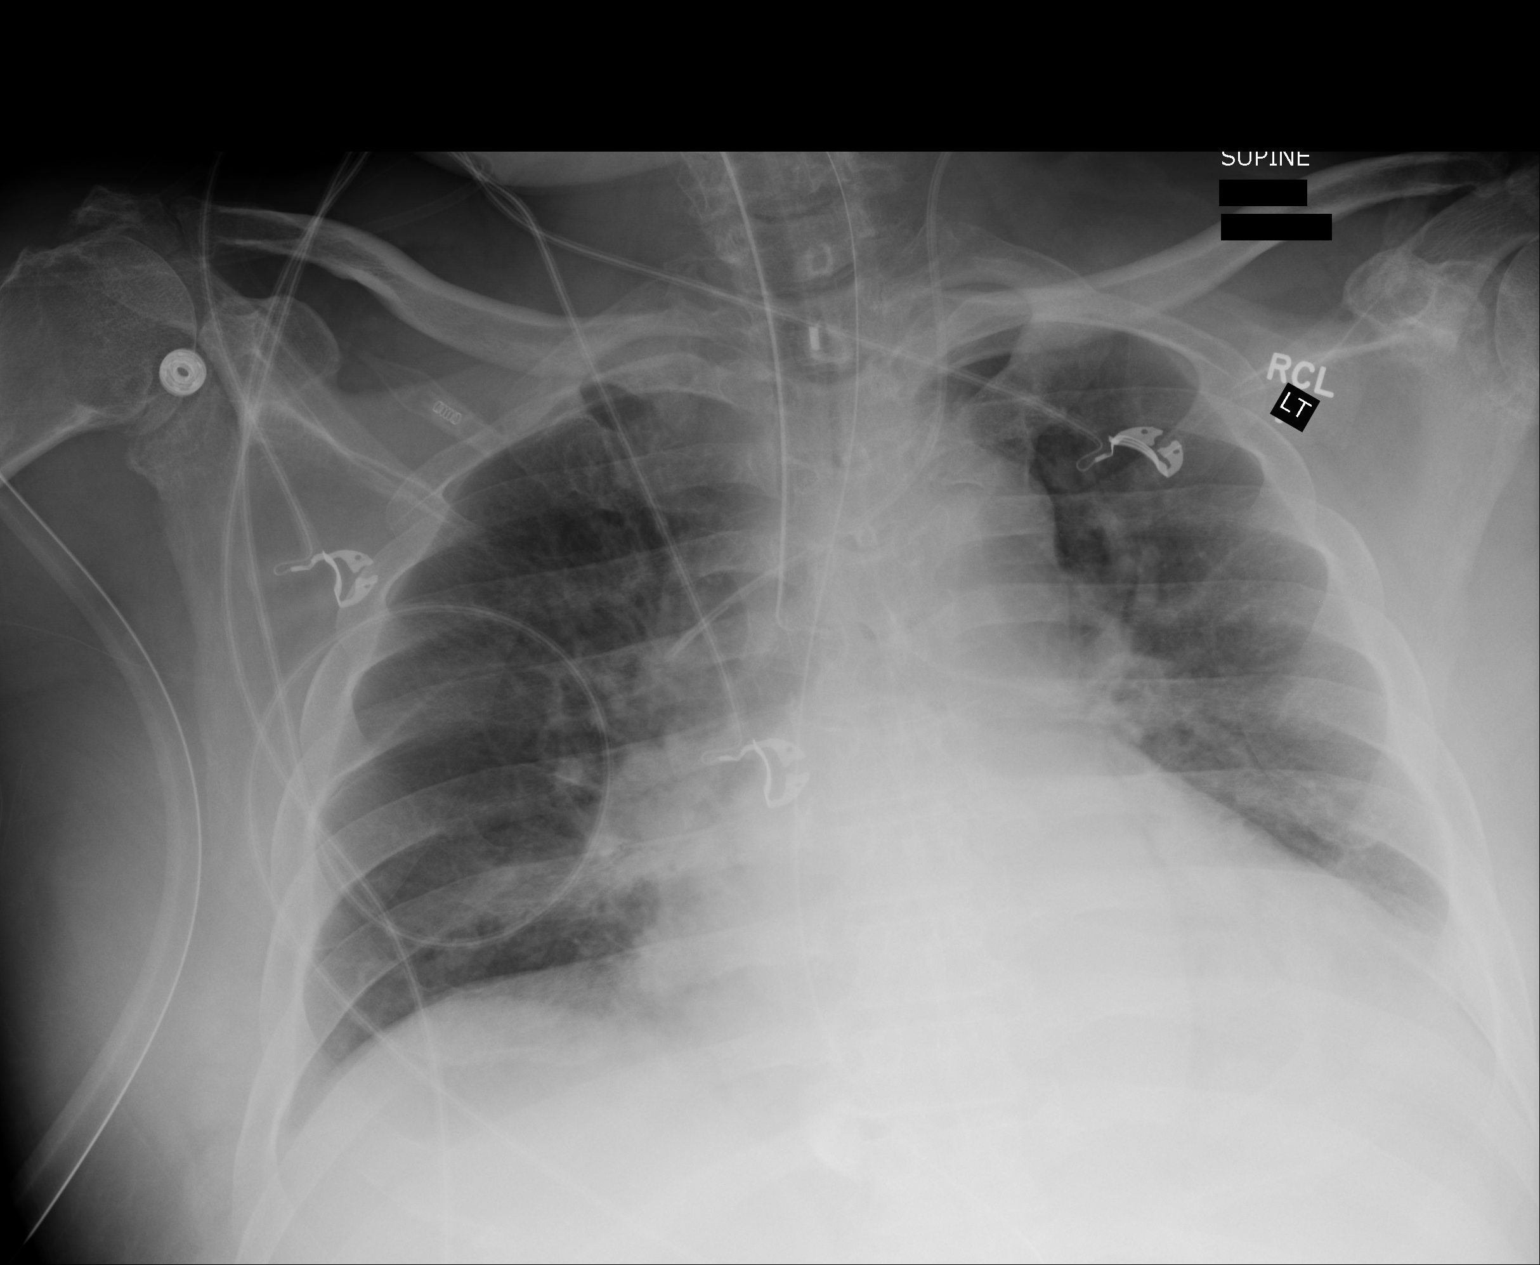

[1 of 1 positions shown; findings below may reference images not displayed]

FINDINGS: Endotracheal tube tip noted just above the orifice of the right
mainstem bronchus. Proximal repositioning should be considered. NG
tube and left IJ line in good anatomic position. Low lung volumes
with bibasilar atelectasis and/or infiltrates. Cardiomegaly with
normal pulmonary vascularity. No acute bony abnormality.
IMPRESSION: 1. Endotracheal tube noted with tip just above the orifice of the
right mainstem bronchus. Proximal repositioning should be
considered.
2. NG tube and left IJ catheter in good anatomic position.
3. Low lung volumes with bibasilar atelectasis and/or infiltrates.
4. Cardiomegaly. Critical Value/emergent results were called by
telephone at the time of interpretation on 12/25/2014 at [DATE] to
nurse Abo Kamal, who verbally acknowledged the results.

## 2016-10-03 IMAGING — CR DG CHEST 1V PORT
1 series · 1 of 1 positions shown · non-contrast
Comparison: December 25, 2014.

CLINICAL DATA: Chest congestion.

EXAM:
PORTABLE CHEST - 1 VIEW

[ap]
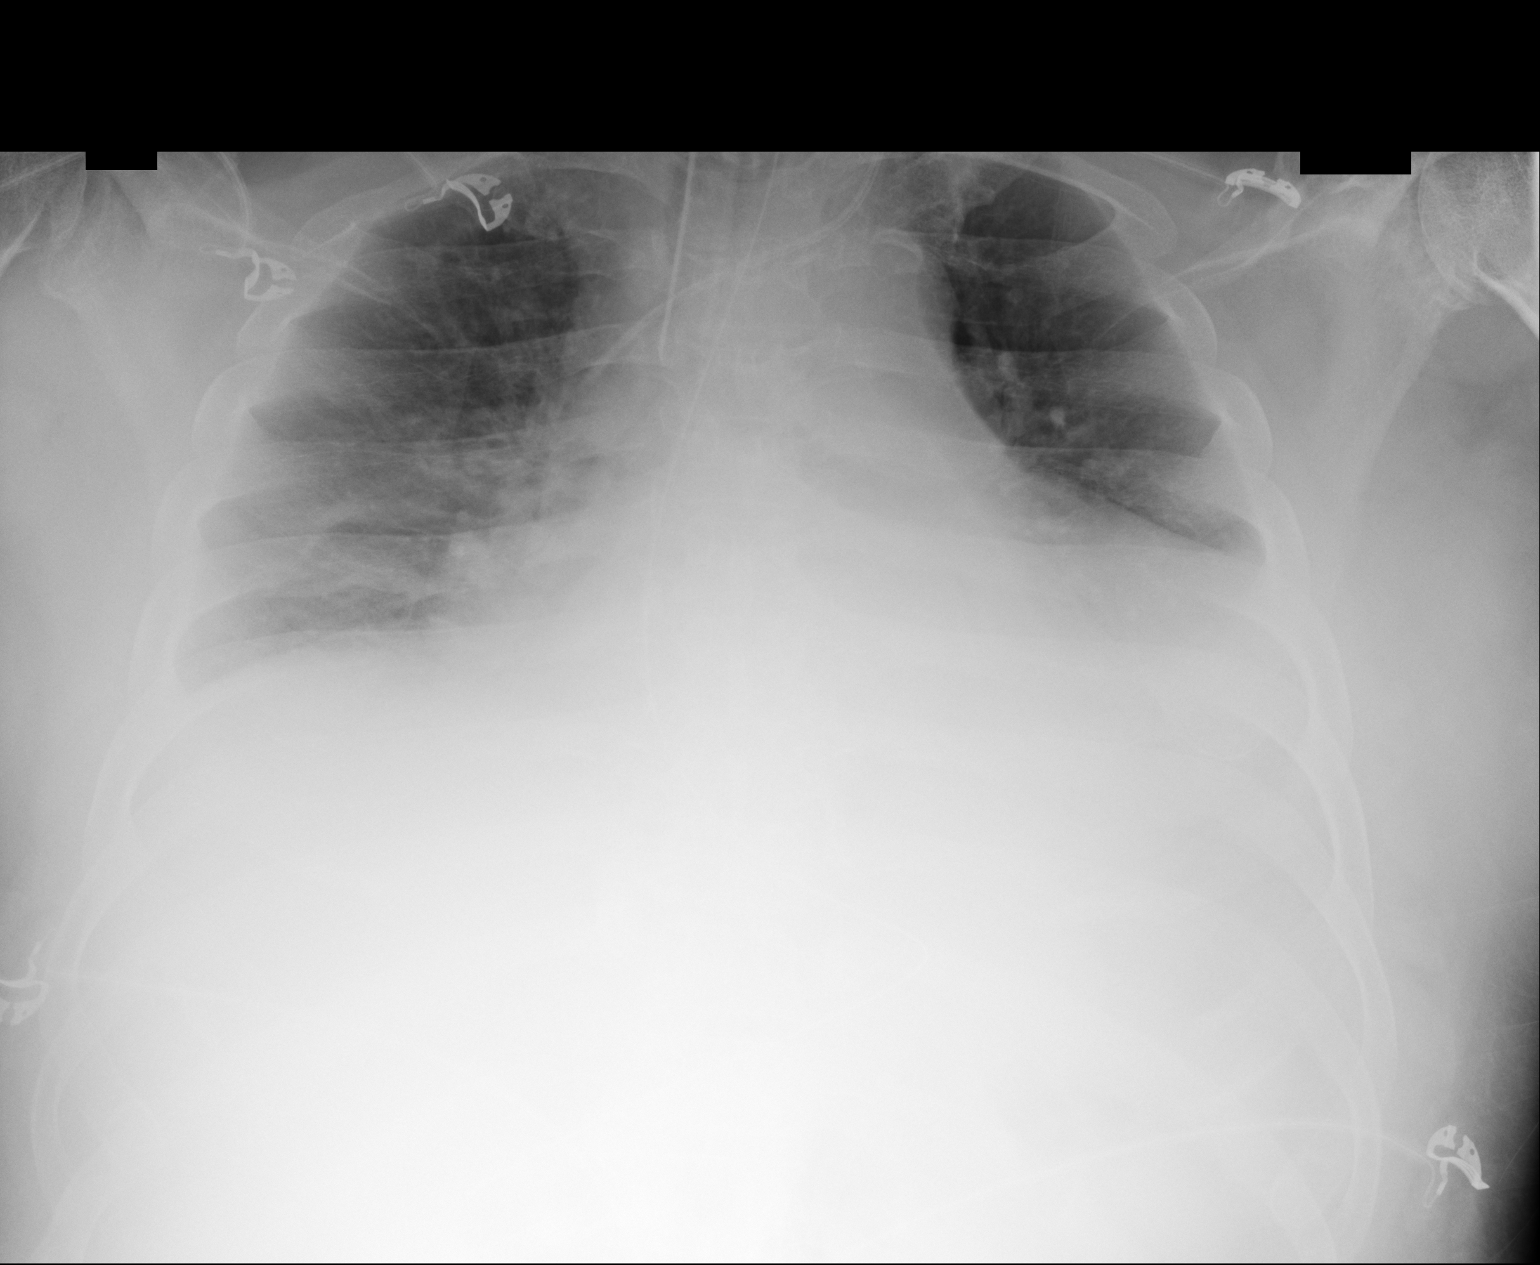

[1 of 1 positions shown; findings below may reference images not displayed]

FINDINGS: Severe hypoinflation of the lungs is noted. No pneumothorax is
noted. Endotracheal tube is noted with distal tip approximately 1 cm
above the carina which is slightly improved compared to prior exam.
Stable left internal jugular catheter is noted with distal tip
overlying expected position of the SVC. Nasogastric tube is seen in
the esophagus, although this distal tip is not well visualized. Mild
bibasilar opacities are noted most consistent with subsegmental
atelectasis. Bony thorax is intact.
IMPRESSION: Severe hypoinflation of the lungs is noted. Mild bibasilar opacities
are noted most consistent with subsegmental atelectasis.
